# Patient Record
Sex: Female | Born: 1960 | Race: White | Hispanic: No | Marital: Married | Smoking: Never smoker
Health system: Southern US, Community
[De-identification: ages and names within clinical notes are randomized; demographics above are authoritative.]

## PROBLEM LIST (undated history)

## (undated) DIAGNOSIS — E785 Hyperlipidemia, unspecified: Secondary | ICD-10-CM

## (undated) DIAGNOSIS — Z87440 Personal history of urinary (tract) infections: Secondary | ICD-10-CM

## (undated) DIAGNOSIS — Z8619 Personal history of other infectious and parasitic diseases: Secondary | ICD-10-CM

## (undated) DIAGNOSIS — F419 Anxiety disorder, unspecified: Secondary | ICD-10-CM

## (undated) HISTORY — PX: WISDOM TOOTH EXTRACTION: SHX21

## (undated) HISTORY — DX: Personal history of urinary (tract) infections: Z87.440

## (undated) HISTORY — DX: Hyperlipidemia, unspecified: E78.5

## (undated) HISTORY — DX: Anxiety disorder, unspecified: F41.9

## (undated) HISTORY — PX: ABDOMINAL HYSTERECTOMY: SHX81

## (undated) HISTORY — DX: Personal history of other infectious and parasitic diseases: Z86.19

---
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
## 2001-12-04 HISTORY — PX: ECTOPIC PREGNANCY SURGERY: SHX613

## 2013-01-05 LAB — HM COLONOSCOPY

## 2016-12-04 HISTORY — PX: HYSTEROTOMY: SHX1776

## 2016-12-04 HISTORY — PX: BLADDER SURGERY: SHX569

## 2017-08-15 DIAGNOSIS — D251 Intramural leiomyoma of uterus: Secondary | ICD-10-CM | POA: Diagnosis not present

## 2017-08-20 DIAGNOSIS — D252 Subserosal leiomyoma of uterus: Secondary | ICD-10-CM | POA: Diagnosis not present

## 2017-08-20 DIAGNOSIS — R3915 Urgency of urination: Secondary | ICD-10-CM | POA: Diagnosis not present

## 2017-08-27 DIAGNOSIS — D251 Intramural leiomyoma of uterus: Secondary | ICD-10-CM | POA: Diagnosis not present

## 2017-09-12 DIAGNOSIS — N950 Postmenopausal bleeding: Secondary | ICD-10-CM | POA: Diagnosis not present

## 2017-09-18 DIAGNOSIS — N83209 Unspecified ovarian cyst, unspecified side: Secondary | ICD-10-CM | POA: Diagnosis not present

## 2017-09-18 DIAGNOSIS — N329 Bladder disorder, unspecified: Secondary | ICD-10-CM | POA: Diagnosis not present

## 2017-09-18 DIAGNOSIS — D252 Subserosal leiomyoma of uterus: Secondary | ICD-10-CM | POA: Diagnosis not present

## 2017-09-18 DIAGNOSIS — N8300 Follicular cyst of ovary, unspecified side: Secondary | ICD-10-CM | POA: Diagnosis not present

## 2017-09-18 DIAGNOSIS — D259 Leiomyoma of uterus, unspecified: Secondary | ICD-10-CM | POA: Diagnosis not present

## 2017-09-24 DIAGNOSIS — E28310 Symptomatic premature menopause: Secondary | ICD-10-CM | POA: Diagnosis not present

## 2017-09-27 DIAGNOSIS — N3001 Acute cystitis with hematuria: Secondary | ICD-10-CM | POA: Diagnosis not present

## 2017-09-27 DIAGNOSIS — R350 Frequency of micturition: Secondary | ICD-10-CM | POA: Diagnosis not present

## 2017-10-16 DIAGNOSIS — Z23 Encounter for immunization: Secondary | ICD-10-CM | POA: Diagnosis not present

## 2017-10-19 DIAGNOSIS — D494 Neoplasm of unspecified behavior of bladder: Secondary | ICD-10-CM | POA: Diagnosis not present

## 2017-10-28 DIAGNOSIS — D259 Leiomyoma of uterus, unspecified: Secondary | ICD-10-CM | POA: Insufficient documentation

## 2017-10-28 DIAGNOSIS — R3915 Urgency of urination: Secondary | ICD-10-CM | POA: Insufficient documentation

## 2017-10-30 DIAGNOSIS — Z01818 Encounter for other preprocedural examination: Secondary | ICD-10-CM | POA: Diagnosis not present

## 2017-11-05 DIAGNOSIS — D494 Neoplasm of unspecified behavior of bladder: Secondary | ICD-10-CM | POA: Diagnosis not present

## 2017-11-05 DIAGNOSIS — Z90710 Acquired absence of both cervix and uterus: Secondary | ICD-10-CM | POA: Diagnosis not present

## 2017-11-05 DIAGNOSIS — F419 Anxiety disorder, unspecified: Secondary | ICD-10-CM | POA: Diagnosis not present

## 2017-11-05 DIAGNOSIS — C678 Malignant neoplasm of overlapping sites of bladder: Secondary | ICD-10-CM | POA: Diagnosis not present

## 2017-11-05 DIAGNOSIS — F1099 Alcohol use, unspecified with unspecified alcohol-induced disorder: Secondary | ICD-10-CM | POA: Diagnosis not present

## 2017-11-05 DIAGNOSIS — N3289 Other specified disorders of bladder: Secondary | ICD-10-CM | POA: Diagnosis not present

## 2017-11-05 DIAGNOSIS — D303 Benign neoplasm of bladder: Secondary | ICD-10-CM | POA: Diagnosis not present

## 2017-11-05 DIAGNOSIS — N329 Bladder disorder, unspecified: Secondary | ICD-10-CM | POA: Diagnosis not present

## 2017-11-05 DIAGNOSIS — Z79899 Other long term (current) drug therapy: Secondary | ICD-10-CM | POA: Diagnosis not present

## 2017-11-05 DIAGNOSIS — R3915 Urgency of urination: Secondary | ICD-10-CM | POA: Diagnosis not present

## 2017-11-19 DIAGNOSIS — R300 Dysuria: Secondary | ICD-10-CM | POA: Diagnosis not present

## 2017-11-19 DIAGNOSIS — N3001 Acute cystitis with hematuria: Secondary | ICD-10-CM | POA: Diagnosis not present

## 2017-11-24 DIAGNOSIS — N390 Urinary tract infection, site not specified: Secondary | ICD-10-CM | POA: Diagnosis not present

## 2018-02-19 DIAGNOSIS — M25531 Pain in right wrist: Secondary | ICD-10-CM | POA: Diagnosis not present

## 2018-02-19 DIAGNOSIS — F411 Generalized anxiety disorder: Secondary | ICD-10-CM | POA: Diagnosis not present

## 2018-02-19 DIAGNOSIS — Z0000 Encounter for general adult medical examination without abnormal findings: Secondary | ICD-10-CM | POA: Diagnosis not present

## 2018-02-19 DIAGNOSIS — E7800 Pure hypercholesterolemia, unspecified: Secondary | ICD-10-CM | POA: Diagnosis not present

## 2018-02-19 DIAGNOSIS — Z23 Encounter for immunization: Secondary | ICD-10-CM | POA: Diagnosis not present

## 2018-02-22 DIAGNOSIS — Z0000 Encounter for general adult medical examination without abnormal findings: Secondary | ICD-10-CM | POA: Diagnosis not present

## 2018-02-22 DIAGNOSIS — E7800 Pure hypercholesterolemia, unspecified: Secondary | ICD-10-CM | POA: Diagnosis not present

## 2018-03-05 DIAGNOSIS — M654 Radial styloid tenosynovitis [de Quervain]: Secondary | ICD-10-CM | POA: Diagnosis not present

## 2018-03-05 DIAGNOSIS — M1811 Unilateral primary osteoarthritis of first carpometacarpal joint, right hand: Secondary | ICD-10-CM | POA: Diagnosis not present

## 2018-03-26 DIAGNOSIS — L820 Inflamed seborrheic keratosis: Secondary | ICD-10-CM | POA: Diagnosis not present

## 2018-03-26 DIAGNOSIS — L570 Actinic keratosis: Secondary | ICD-10-CM | POA: Diagnosis not present

## 2018-03-26 DIAGNOSIS — D1801 Hemangioma of skin and subcutaneous tissue: Secondary | ICD-10-CM | POA: Diagnosis not present

## 2018-03-26 DIAGNOSIS — L821 Other seborrheic keratosis: Secondary | ICD-10-CM | POA: Diagnosis not present

## 2018-03-26 DIAGNOSIS — L814 Other melanin hyperpigmentation: Secondary | ICD-10-CM | POA: Diagnosis not present

## 2018-03-26 DIAGNOSIS — D225 Melanocytic nevi of trunk: Secondary | ICD-10-CM | POA: Diagnosis not present

## 2019-03-12 ENCOUNTER — Telehealth: Payer: Self-pay | Admitting: Family Medicine

## 2019-03-12 NOTE — Telephone Encounter (Signed)
Marylynn Pearson,  I touched base with Dr Milinda Antis who said it would be ok for your wife to establish with her, but she would have to wait until we are again seeing new patients in the office (we're currently not seeing any physicals or new patients in the office due to the coronavirus pandemic so probably a few months out). Also, since she is an established provider it may sometimes be more difficult to get in to see her for acute visits.   I will touch base with our front office staff as well about this.

## 2019-06-30 ENCOUNTER — Ambulatory Visit: Payer: BC Managed Care – PPO | Admitting: Family Medicine

## 2019-06-30 ENCOUNTER — Encounter: Payer: Self-pay | Admitting: Family Medicine

## 2019-06-30 ENCOUNTER — Other Ambulatory Visit: Payer: Self-pay

## 2019-06-30 VITALS — BP 126/68 | HR 81 | Temp 98.0°F | Ht 64.5 in | Wt 148.2 lb

## 2019-06-30 DIAGNOSIS — Z1231 Encounter for screening mammogram for malignant neoplasm of breast: Secondary | ICD-10-CM | POA: Diagnosis not present

## 2019-06-30 DIAGNOSIS — H9193 Unspecified hearing loss, bilateral: Secondary | ICD-10-CM | POA: Diagnosis not present

## 2019-06-30 DIAGNOSIS — H9190 Unspecified hearing loss, unspecified ear: Secondary | ICD-10-CM | POA: Insufficient documentation

## 2019-06-30 DIAGNOSIS — F411 Generalized anxiety disorder: Secondary | ICD-10-CM | POA: Diagnosis not present

## 2019-06-30 DIAGNOSIS — F419 Anxiety disorder, unspecified: Secondary | ICD-10-CM | POA: Insufficient documentation

## 2019-06-30 MED ORDER — VENLAFAXINE HCL ER 75 MG PO CP24: 75.0000 mg | ORAL_CAPSULE | Freq: Every day | ORAL | 3 refills | Status: DC

## 2019-06-30 NOTE — Assessment & Plan Note (Signed)
Due for screening mammogram since moving her  Enc self exams Ref done for mammogram at [facility]

## 2019-06-30 NOTE — Assessment & Plan Note (Signed)
Hearing screen nl today  Will continue to follow

## 2019-06-30 NOTE — Progress Notes (Signed)
Subjective:    Patient ID: Synetta ShadowNancy Pha, female    DOB: 01/07/1961, 58 y.o.   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 Just wrapped up 30 y  Kind of semi retired   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  She gets 8 hours of sleep per night   Exercise-swim and walks for the most part  Plans to increase her swimming   Healthy-non smoker  Drinks 1 glass of wine per day   Sees gyn  Had a hysterectomy- for large fibroid  Gyn visit in 11/19   On HRT -patch- to disc reduction of dose at next visit -vivelle dot Does not have a lot of menopausal issues   Medical history  2018 had removal of B9 growth in bladder  2003 had ectopic pregnancy  Desires referral for mammogram  Widow - in 2011  Given effexor for anxiety/stress reaction  Still helps a lot - when she tried to decrease dose she became irritable and more anxious   BP Readings from Last 3 Encounters:  06/30/19 126/68   Pulse Rate: 81    Weight: 148 lb 4 oz (67.2 kg)  25.05 kg/m   Has not been screened for HIV /hep C Low risk and not interested   Thinks she had tetanus shot for her last physical 2019-? Not sure   Colonoscopy 2014 - normal with 10 y recall   Patient Active Problem List   Diagnosis Date Noted  . Anxiety disorder 06/30/2019  . Screening mammogram, encounter for 06/30/2019  . Hearing decreased 06/30/2019   Past Medical History:  Diagnosis Date  . History of chickenpox   . History  of UTI    Social History   Tobacco Use  . Smoking status: Never Smoker  . Smokeless tobacco: Never Used  Substance Use Topics  . Alcohol use: Yes    Comment: glass of wine a day  . Drug use: Never   Family History  Problem Relation Age of Onset  . Alzheimer's disease Mother   . Heart failure Father   . Hyperlipidemia Father   . COPD Brother   . Depression Brother   . Hyperlipidemia Brother   . Arthritis Paternal Grandmother    No Known Allergies Current Outpatient Medications on File Prior to Visit  Medication Sig Dispense Refill  . diclofenac sodium (VOLTAREN) 1 % GEL Apply 2 g topically daily as needed.    Marland Kitchen. estradiol (VIVELLE-DOT) 0.1 MG/24HR patch PLACE 1 PATCH ONTO SKIN TWICE A WEEK. TAKE OFF PREVIOUS BEFORE PUTTING ON NEW PATCH     No current facility-administered medications on file prior to visit.      Review of Systems  Constitutional: Negative for activity change, appetite change, fatigue, fever and unexpected weight change.  HENT: Positive  for hearing loss. Negative for congestion, ear discharge, ear pain, rhinorrhea, sinus pressure and sore throat.   Eyes: Negative for pain, redness and visual disturbance.  Respiratory: Negative for cough, shortness of breath and wheezing.   Cardiovascular: Negative for chest pain and palpitations.  Gastrointestinal: Negative for abdominal pain, blood in stool, constipation and diarrhea.  Endocrine: Negative for polydipsia and polyuria.  Genitourinary: Negative for dysuria, frequency and urgency.  Musculoskeletal: Negative for arthralgias, back pain and myalgias.  Skin: Negative for pallor and rash.  Allergic/Immunologic: Negative for environmental allergies.  Neurological: Negative for dizziness, syncope and headaches.  Hematological: Negative for adenopathy. Does not bruise/bleed easily.  Psychiatric/Behavioral: Positive for sleep disturbance. Negative for agitation, behavioral problems, decreased concentration, dysphoric  mood and self-injury. The patient is nervous/anxious.        Objective:   Physical Exam Constitutional:      General: She is not in acute distress.    Appearance: Normal appearance. She is well-developed and normal weight. She is not ill-appearing or diaphoretic.  HENT:     Head: Normocephalic and atraumatic.     Right Ear: Tympanic membrane, ear canal and external ear normal.     Left Ear: Tympanic membrane, ear canal and external ear normal.     Nose: Nose normal.     Mouth/Throat:     Mouth: Mucous membranes are moist.     Pharynx: Oropharynx is clear. No posterior oropharyngeal erythema.  Eyes:     General: No scleral icterus.       Right eye: No discharge.        Left eye: No discharge.     Conjunctiva/sclera: Conjunctivae normal.     Pupils: Pupils are equal, round, and reactive to light.  Neck:     Musculoskeletal: Normal range of motion and neck supple. No neck rigidity or muscular tenderness.     Thyroid: No thyromegaly.     Vascular: No carotid bruit or JVD.  Cardiovascular:     Rate and Rhythm: Normal rate and regular rhythm.     Heart sounds: Normal heart sounds. No gallop.   Pulmonary:     Effort: Pulmonary effort is normal. No respiratory distress.     Breath sounds: Normal breath sounds. No wheezing or rales.     Comments: Good air exch Abdominal:     General: Bowel sounds are normal. There is no distension.     Palpations: Abdomen is soft. There is no mass.     Tenderness: There is no abdominal tenderness.     Hernia: No hernia is present.  Musculoskeletal:        General: No tenderness.     Right lower leg: No edema.     Left lower leg: No edema.  Lymphadenopathy:     Cervical: No cervical adenopathy.  Skin:    General: Skin is warm and dry.     Coloration: Skin is not pale.     Findings: No erythema or rash.     Comments: Solar lentigines diffusely   Neurological:     Mental Status: She is alert.     Cranial Nerves: No cranial nerve deficit.      Motor: No abnormal muscle tone.     Coordination: Coordination normal.     Gait: Gait normal.     Deep Tendon Reflexes: Reflexes are normal and symmetric. Reflexes normal.  Psychiatric:        Attention and Perception: She is attentive.  Mood and Affect: Mood normal. Mood is not anxious or depressed.        Speech: Speech normal.        Behavior: Behavior normal.        Cognition and Memory: Cognition and memory normal.     Comments: Pleasant and talkative            Assessment & Plan:   Problem List Items Addressed This Visit      Other   Anxiety disorder - Primary    Pt takes effexor 75 mg XR for years  Very helpful -since she was widowed in 2011 Unable to stop it successfully  Not seeing counselor Good self care habits       Relevant Medications   venlafaxine XR (EFFEXOR-XR) 75 MG 24 hr capsule   Screening mammogram, encounter for    Due for screening mammogram since moving her  Enc self exams Ref done for mammogram at [facility]       Relevant Orders   MM 3D SCREEN BREAST BILATERAL   Hearing decreased    Hearing screen nl today  Will continue to follow

## 2019-06-30 NOTE — Patient Instructions (Addendum)
Make sure to schedule your mammogram at the [facility] breast center   Make sure you get a flu shot in the fall   I will send for last info from Dr Germain Osgood   Take care of yourself  Stay active   Let's do a hearing screen

## 2019-06-30 NOTE — Assessment & Plan Note (Signed)
Pt takes effexor 75 mg XR for years  Very helpful -since she was widowed in 2011 Unable to stop it successfully  Not seeing counselor Good self care habits

## 2019-07-01 ENCOUNTER — Ambulatory Visit
Admission: RE | Admit: 2019-07-01 | Discharge: 2019-07-01 | Disposition: A | Discharge status: Home / Self Care | Payer: BC Managed Care – PPO | Source: Ambulatory Visit | Attending: Family Medicine | Admitting: Family Medicine

## 2019-07-01 DIAGNOSIS — Z1231 Encounter for screening mammogram for malignant neoplasm of breast: Secondary | ICD-10-CM | POA: Insufficient documentation

## 2019-07-30 ENCOUNTER — Telehealth: Payer: Self-pay

## 2019-07-30 NOTE — Telephone Encounter (Signed)
Pt left v/m requesting cb about process for getting flu shot;FYI to [place] at front desk.

## 2019-07-30 NOTE — Telephone Encounter (Signed)
Patient scheduled flu shot appointment on 08/01/19.

## 2019-08-01 ENCOUNTER — Ambulatory Visit (INDEPENDENT_AMBULATORY_CARE_PROVIDER_SITE_OTHER): Payer: BC Managed Care – PPO

## 2019-08-01 DIAGNOSIS — Z23 Encounter for immunization: Secondary | ICD-10-CM

## 2019-10-21 DIAGNOSIS — F4322 Adjustment disorder with anxiety: Secondary | ICD-10-CM | POA: Diagnosis not present

## 2019-11-21 ENCOUNTER — Other Ambulatory Visit: Payer: BC Managed Care – PPO

## 2019-12-08 ENCOUNTER — Encounter: Payer: Self-pay | Admitting: Family Medicine

## 2019-12-08 ENCOUNTER — Ambulatory Visit (INDEPENDENT_AMBULATORY_CARE_PROVIDER_SITE_OTHER): Payer: BC Managed Care – PPO | Admitting: Family Medicine

## 2019-12-08 ENCOUNTER — Other Ambulatory Visit: Payer: Self-pay

## 2019-12-08 VITALS — BP 116/70 | HR 50 | Temp 96.6°F | Ht 64.5 in | Wt 154.5 lb

## 2019-12-08 DIAGNOSIS — L290 Pruritus ani: Secondary | ICD-10-CM | POA: Insufficient documentation

## 2019-12-08 DIAGNOSIS — L299 Pruritus, unspecified: Secondary | ICD-10-CM | POA: Diagnosis not present

## 2019-12-08 MED ORDER — HYDROCORTISONE 1 % EX CREA: 1.0000 "application " | TOPICAL_CREAM | Freq: Two times a day (BID) | CUTANEOUS | 0 refills | Status: DC

## 2019-12-08 MED ORDER — CETIRIZINE HCL 10 MG PO TABS: 10.0000 mg | ORAL_TABLET | Freq: Every day | ORAL | 0 refills | Status: DC

## 2019-12-08 NOTE — Assessment & Plan Note (Signed)
Slight scale in ear canals  ? If allergy related or eczema/dermatitis Try tx with 1% hydrocortisone bid prn - small amt to ear canals Also antihistamine zyrtec 10 mg each bedtime (change from allegra)  Update if not starting to improve in a week or if worsening

## 2019-12-08 NOTE — Patient Instructions (Addendum)
Take zyrtec 10 mg each bedtime (generic is fine)  Stop the allegra    Try the 1% hydrocortisone cream twice daily to ear canals and also anal area (do not exceed 14 days in a row)   Keep the anal area dry (after bathing)  Use regular toilet paper (nothing wet)  A barrier cream like A and D or Desitin is good for maintenance   Let us know if no improvement in 1-2 weeks

## 2019-12-08 NOTE — Assessment & Plan Note (Signed)
No findings on exam -unsure of cause Px hydrocortisone cream to try topically (1%) bid for 1-2 weeks Then inst to switch over to barrier cream (use after washing /drying completely) Update if not starting to improve in a week or if worsening

## 2019-12-08 NOTE — Progress Notes (Signed)
Subjective:    Patient ID: Beth Nielsen, female    DOB: [birth year], 59 y.o.   MRN: [id number]  HPI Pt presents with ear pain -bilateral/ worse in Right  Itchy  Going on for about 9 months on and off  She used some anti fungal cream - it helped a little perhaps   ? From sweat or water  Swims seasonally  No pain/ tenderness of external  Hearing is not changed  No popping  No drainage   No h/o eczema or dandruff   No nasal congestion  A little chronic rhinitis -allergy   Also anal itching -this is new  - a few months  Worse at night  Has not been able to look at area  Right at anal opening  Has not felt a lump  No diarrhea or constipation   Had to change toilet paper brands during covid  No wet wipes Does shower every day-uses dove soap    No exposure to pin worms   She takes antihistamines otc  Allegra right now   Patient Active Problem List   Diagnosis Date Noted  . Anal itching 12/08/2019  . Ear itching 12/08/2019  . Anxiety disorder 06/30/2019  . Screening mammogram, encounter for 06/30/2019  . Hearing decreased 06/30/2019   Past Medical History:  Diagnosis Date  . History of chickenpox   . History of UTI    Past Surgical History:  Procedure Laterality Date  . BLADDER SURGERY  2018   removal of benign growth  . ECTOPIC PREGNANCY SURGERY  2003  . HYSTEROTOMY  2018  . TONSILLECTOMY  1972  . WISDOM TOOTH EXTRACTION     age 28   Social History   Tobacco Use  . Smoking status: Never Smoker  . Smokeless tobacco: Never Used  Substance Use Topics  . Alcohol use: Yes    Comment: glass of wine a day  . Drug use: Never   Family History  Problem Relation Age of Onset  . Alzheimer's disease Mother   . Heart failure Father   . Hyperlipidemia Father   . COPD Brother   . Depression Brother   . Hyperlipidemia Brother   . Arthritis Paternal Grandmother   . Breast cancer Neg Hx    No Known Allergies Current Outpatient Medications on File Prior to  Visit  Medication Sig Dispense Refill  . diclofenac sodium (VOLTAREN) 1 % GEL Apply 2 g topically daily as needed.    Marland Kitchen estradiol (VIVELLE-DOT) 0.1 MG/24HR patch PLACE 1 PATCH ONTO SKIN TWICE A WEEK. TAKE OFF PREVIOUS BEFORE PUTTING ON NEW PATCH    . venlafaxine XR (EFFEXOR-XR) 75 MG 24 hr capsule Take 1 capsule (75 mg total) by mouth daily. 90 capsule 3   No current facility-administered medications on file prior to visit.     Review of Systems  Constitutional: Negative for activity change, appetite change, fatigue, fever and unexpected weight change.  HENT: Positive for postnasal drip. Negative for congestion, ear discharge, ear pain, facial swelling, hearing loss, rhinorrhea, sinus pressure, sore throat, trouble swallowing and voice change.   Eyes: Negative for pain, redness and visual disturbance.  Respiratory: Negative for cough, shortness of breath and wheezing.   Cardiovascular: Negative for chest pain and palpitations.  Gastrointestinal: Negative for abdominal distention, abdominal pain, anal bleeding, blood in stool, constipation, diarrhea and rectal pain.  Endocrine: Negative for polydipsia and polyuria.  Genitourinary: Negative for dysuria, frequency and urgency.  Musculoskeletal: Negative for arthralgias, back pain and myalgias.  Skin: Negative for color change, pallor and rash.  Allergic/Immunologic: Negative for environmental allergies.  Neurological: Negative for dizziness, syncope and headaches.  Hematological: Negative for adenopathy. Does not bruise/bleed easily.  Psychiatric/Behavioral: Negative for decreased concentration and dysphoric mood. The patient is not nervous/anxious.        Objective:   Physical Exam Constitutional:      Appearance: Normal appearance. She is normal weight. She is not ill-appearing.  HENT:     Head: Normocephalic and atraumatic.     Right Ear: Tympanic membrane and external ear normal. There is no impacted cerumen.     Left Ear: Tympanic  membrane and external ear normal. There is no impacted cerumen.     Ears:     Comments: Scant flaky skin inside ear canals (worse on the R)  Nl appearing TM No erythema or swelling or drainage      Nose: Nose normal.     Comments: Nares are boggy    Mouth/Throat:     Mouth: Mucous membranes are moist.     Pharynx: Oropharynx is clear. No posterior oropharyngeal erythema.  Eyes:     General:        Right eye: No discharge.        Left eye: No discharge.     Extraocular Movements: Extraocular movements intact.     Conjunctiva/sclera: Conjunctivae normal.     Pupils: Pupils are equal, round, and reactive to light.  Cardiovascular:     Rate and Rhythm: Regular rhythm. Bradycardia present.     Pulses: Normal pulses.  Pulmonary:     Effort: Pulmonary effort is normal. No respiratory distress.     Breath sounds: Normal breath sounds. No wheezing or rales.  Genitourinary:    Comments: Nl appearing anal area  No skin change  No excoriation No hemorrhoids  Musculoskeletal:     Cervical back: Neck supple.  Lymphadenopathy:     Cervical: No cervical adenopathy.  Skin:    General: Skin is warm and dry.     Coloration: Skin is not jaundiced or pale.     Findings: No erythema or lesion.     Comments: slt scaling of skin in ear canals   Neurological:     Mental Status: She is alert.     Cranial Nerves: No cranial nerve deficit.  Psychiatric:        Mood and Affect: Mood normal.           Assessment & Plan:   Problem List Items Addressed This Visit      Musculoskeletal and Integument   Anal itching    No findings on exam -unsure of cause Px hydrocortisone cream to try topically (1%) bid for 1-2 weeks Then inst to switch over to barrier cream (use after washing /drying completely) Update if not starting to improve in a week or if worsening          Other   Ear itching - Primary    Slight scale in ear canals  ? If allergy related or eczema/dermatitis Try tx with 1%  hydrocortisone bid prn - small amt to ear canals Also antihistamine zyrtec 10 mg each bedtime (change from allegra)  Update if not starting to improve in a week or if worsening

## 2019-12-29 ENCOUNTER — Ambulatory Visit: Payer: BC Managed Care – PPO | Attending: Internal Medicine

## 2019-12-29 DIAGNOSIS — Z20822 Contact with and (suspected) exposure to covid-19: Secondary | ICD-10-CM

## 2019-12-30 LAB — NOVEL CORONAVIRUS, NAA: SARS-CoV-2, NAA: NOT DETECTED

## 2020-01-09 ENCOUNTER — Ambulatory Visit: Payer: BC Managed Care – PPO | Attending: Internal Medicine

## 2020-01-09 DIAGNOSIS — Z20822 Contact with and (suspected) exposure to covid-19: Secondary | ICD-10-CM | POA: Diagnosis not present

## 2020-01-11 LAB — NOVEL CORONAVIRUS, NAA: SARS-CoV-2, NAA: NOT DETECTED

## 2020-01-27 DIAGNOSIS — Z79890 Hormone replacement therapy (postmenopausal): Secondary | ICD-10-CM | POA: Diagnosis not present

## 2020-01-27 DIAGNOSIS — Z01419 Encounter for gynecological examination (general) (routine) without abnormal findings: Secondary | ICD-10-CM | POA: Diagnosis not present

## 2020-01-27 DIAGNOSIS — N951 Menopausal and female climacteric states: Secondary | ICD-10-CM | POA: Diagnosis not present

## 2020-01-28 ENCOUNTER — Other Ambulatory Visit: Payer: BC Managed Care – PPO

## 2020-01-29 ENCOUNTER — Other Ambulatory Visit: Payer: BC Managed Care – PPO

## 2020-02-01 ENCOUNTER — Ambulatory Visit: Payer: BC Managed Care – PPO | Attending: Internal Medicine

## 2020-02-01 DIAGNOSIS — Z23 Encounter for immunization: Secondary | ICD-10-CM | POA: Insufficient documentation

## 2020-02-01 NOTE — Progress Notes (Signed)
   Covid-19 Vaccination Clinic  Name:  Beth Nielsen    MRN: [id number] DOB: 08/22/1961  02/01/2020  [name] was observed post Covid-19 immunization for 15 minutes without incidence. She was provided with Vaccine Information Sheet and instruction to access the V-Safe system.   [name] was instructed to call 911 with any severe reactions post vaccine: Marland Kitchen Difficulty breathing  . Swelling of your face and throat  . A fast heartbeat  . A bad rash all over your body  . Dizziness and weakness    Immunizations Administered    Name Date Dose VIS Date Route   Pfizer COVID-19 Vaccine 02/01/2020  8:59 AM 0.3 mL 11/14/2019 Intramuscular   Manufacturer: [employer], [employer]   Lot: [id number]   NDC: [address]-6

## 2020-02-23 ENCOUNTER — Ambulatory Visit: Payer: BC Managed Care – PPO | Attending: Internal Medicine

## 2020-02-23 DIAGNOSIS — Z23 Encounter for immunization: Secondary | ICD-10-CM

## 2020-02-23 NOTE — Progress Notes (Signed)
   Covid-19 Vaccination Clinic  Name:  Beth Nielsen    MRN: [id number] DOB: [birth year]  02/23/2020  [name] was observed post Covid-19 immunization for 15 minutes without incident. She was provided with Vaccine Information Sheet and instruction to access the V-Safe system.   [name] was instructed to call 911 with any severe reactions post vaccine: Marland Kitchen Difficulty breathing  . Swelling of face and throat  . A fast heartbeat  . A bad rash all over body  . Dizziness and weakness   Immunizations Administered    Name Date Dose VIS Date Route   Pfizer COVID-19 Vaccine 02/23/2020  9:07 AM 0.3 mL 11/14/2019 Intramuscular   Manufacturer: [employer], [employer]   Lot: [id number]   NDC: [address]-8

## 2020-05-24 ENCOUNTER — Other Ambulatory Visit: Payer: Self-pay | Admitting: Family Medicine

## 2020-05-24 ENCOUNTER — Other Ambulatory Visit: Payer: Self-pay | Admitting: Obstetrics and Gynecology

## 2020-05-24 DIAGNOSIS — Z1231 Encounter for screening mammogram for malignant neoplasm of breast: Secondary | ICD-10-CM

## 2020-06-20 ENCOUNTER — Other Ambulatory Visit: Payer: Self-pay | Admitting: Family Medicine

## 2020-06-30 ENCOUNTER — Telehealth (INDEPENDENT_AMBULATORY_CARE_PROVIDER_SITE_OTHER): Payer: BC Managed Care – PPO | Admitting: Family Medicine

## 2020-06-30 ENCOUNTER — Encounter: Payer: Self-pay | Admitting: Family Medicine

## 2020-06-30 VITALS — Temp 97.1°F | Ht 65.0 in | Wt 148.0 lb

## 2020-06-30 DIAGNOSIS — J029 Acute pharyngitis, unspecified: Secondary | ICD-10-CM | POA: Diagnosis not present

## 2020-06-30 NOTE — Assessment & Plan Note (Signed)
Most likely allergies vs. Viral infeciton. Pt s/p vaccination for COVID.Marland Kitchen doubt COVID as not typical presentation.  No clear sign of bacteria infection. Strep less likely given age, mild symptoms and presence of mucus.   treat with flonase and change to Zyrtec at bedtime. If not improving.. recommend in person eval at [facility] for strep test, COPVID test and eval of ear and throat with exam.

## 2020-06-30 NOTE — Patient Instructions (Addendum)
Flonase 2 sprays per nostril daily.  Change to zyrtec at bedtime.  Rest and fluids  If not improving in 48 hours. Consider strep test, ear evaluation and COVID test at [facility].

## 2020-06-30 NOTE — Progress Notes (Signed)
VIRTUAL VISIT Due to national recommendations of social distancing due to COVID 19, a virtual visit is felt to be most appropriate for this patient at this time.   I connected with the patient on 06/30/20 at 12:00 PM EDT by virtual telehealth platform and verified that I am speaking with the correct person using two identifiers.   I discussed the limitations, risks, security and privacy concerns of performing an evaluation and management service by  virtual telehealth platform and the availability of in person appointments. I also discussed with the patient that there may be a patient responsible charge related to this service. The patient expressed understanding and agreed to proceed.  Patient location: Home Provider Location: [place] [facility] Participants: Kerby Nora and Synetta Shadow   Chief Complaint  Patient presents with  . Sore Throat    x 4 days-one side of throat  . Nasal Congestion    History of Present Illness: Sore Throat  This is a new problem. The current episode started in the past 7 days (4 days). The problem has been gradually worsening. The pain is worse on the right side. There has been no fever. The pain is at a severity of 2/10. The pain is mild. Associated symptoms include ear pain. Pertinent negatives include no congestion, coughing, shortness of breath, trouble swallowing or vomiting. Associated symptoms comments: Right ear pressure, has some post nasal drip. She has had no exposure to strep or mono. Treatments tried: has not improved with allergy pill: loratadine 10 mg  The treatment provided no relief.   Husband is going to [place] next week.   No sick contact... no strep , no mono.  She no longer wears a mask. S/P COVID19 vaccine.  COVID 19 screen No recent travel or known exposure to COVID19   The importance of social distancing was discussed today.   Review of Systems  HENT: Positive for ear pain. Negative for congestion and trouble swallowing.    Respiratory: Negative for cough and shortness of breath.   Gastrointestinal: Negative for vomiting.      Past Medical History:  Diagnosis Date  . History of chickenpox   . History of UTI     reports that she has never smoked. She has never used smokeless tobacco. She reports current alcohol use. She reports that she does not use drugs.   Current Outpatient Medications:  .  diclofenac sodium (VOLTAREN) 1 % GEL, Apply 2 g topically daily as needed., Disp: , Rfl:  .  estradiol (VIVELLE-DOT) 0.1 MG/24HR patch, PLACE 1 PATCH ONTO SKIN TWICE A WEEK. TAKE OFF PREVIOUS BEFORE PUTTING ON NEW PATCH, Disp: , Rfl:  .  hydrocortisone cream 1 %, Apply 1 application topically 2 (two) times daily. To affected areas as needed, do not exceed 14 days of continuous use, Disp: 30 g, Rfl: 0 .  venlafaxine XR (EFFEXOR-XR) 75 MG 24 hr capsule, TAKE 1 CAPSULE BY MOUTH EVERY DAY, Disp: 90 capsule, Rfl: 3 .  cetirizine (ZYRTEC) 10 MG tablet, Take 1 tablet (10 mg total) by mouth daily. (Patient not taking: Reported on 06/30/2020), Disp: 1 tablet, Rfl: 0   Observations/Objective: Temperature (!) 97.1 F (36.2 C), temperature source Oral, height 5\' 5"  (1.651 m), weight 148 lb (67.1 kg).  Physical Exam  Physical Exam Constitutional:      General: The patient is not in acute distress. Pulmonary:     Effort: Pulmonary effort is normal. No respiratory distress.  Neurological:     Mental Status: The patient  is alert and oriented to person, place, and time.  Psychiatric:        Mood and Affect: Mood normal.        Behavior: Behavior normal.    Assessment and Plan  Sore throat Most likely allergies vs. Viral infeciton. Pt s/p vaccination for COVID.Marland Kitchen doubt COVID as not typical presentation.  No clear sign of bacteria infection. Strep less likely given age, mild symptoms and presence of mucus.   treat with flonase and change to Zyrtec at bedtime. If not improving.. recommend in person eval at [facility] for  strep test, COPVID test and eval of ear and throat with exam.    I discussed the assessment and treatment plan with the patient. The patient was provided an opportunity to ask questions and all were answered. The patient agreed with the plan and demonstrated an understanding of the instructions.   The patient was advised to call back or seek an in-person evaluation if the symptoms worsen or if the condition fails to improve as anticipated.     Kerby Nora, MD

## 2020-07-02 ENCOUNTER — Other Ambulatory Visit: Payer: Self-pay

## 2020-07-02 ENCOUNTER — Ambulatory Visit
Admission: RE | Admit: 2020-07-02 | Discharge: 2020-07-02 | Disposition: A | Discharge status: Home / Self Care | Payer: BC Managed Care – PPO | Source: Ambulatory Visit | Attending: Obstetrics and Gynecology | Admitting: Obstetrics and Gynecology

## 2020-07-02 DIAGNOSIS — Z1231 Encounter for screening mammogram for malignant neoplasm of breast: Secondary | ICD-10-CM | POA: Insufficient documentation

## 2020-07-03 DIAGNOSIS — J029 Acute pharyngitis, unspecified: Secondary | ICD-10-CM | POA: Diagnosis not present

## 2020-07-03 DIAGNOSIS — H6980 Other specified disorders of Eustachian tube, unspecified ear: Secondary | ICD-10-CM | POA: Diagnosis not present

## 2020-07-04 ENCOUNTER — Telehealth: Payer: Self-pay | Admitting: Family Medicine

## 2020-07-04 DIAGNOSIS — Z0000 Encounter for general adult medical examination without abnormal findings: Secondary | ICD-10-CM | POA: Insufficient documentation

## 2020-07-04 NOTE — Telephone Encounter (Signed)
-----   Message from Aquilla Solian, RT sent at 06/29/2020  3:08 PM EDT ----- Regarding: Lab Orders for Monday 8.2.2021 Please place lab orders for Monday 8.2.2021, office visit for physical on Monday 8.9.2021 Thank you, Jones Bales RT(R)

## 2020-07-05 ENCOUNTER — Other Ambulatory Visit: Payer: Self-pay

## 2020-07-05 ENCOUNTER — Other Ambulatory Visit (INDEPENDENT_AMBULATORY_CARE_PROVIDER_SITE_OTHER): Payer: BC Managed Care – PPO

## 2020-07-05 DIAGNOSIS — Z0000 Encounter for general adult medical examination without abnormal findings: Secondary | ICD-10-CM | POA: Diagnosis not present

## 2020-07-05 LAB — COMPREHENSIVE METABOLIC PANEL
ALT: 11 U/L (ref 0–35)
AST: 16 U/L (ref 0–37)
Albumin: 4.2 g/dL (ref 3.5–5.2)
Alkaline Phosphatase: 50 U/L (ref 39–117)
BUN: 15 mg/dL (ref 6–23)
CO2: 29 mEq/L (ref 19–32)
Calcium: 9.4 mg/dL (ref 8.4–10.5)
Chloride: 103 mEq/L (ref 96–112)
Creatinine, Ser: 0.75 mg/dL (ref 0.40–1.20)
GFR: 79.58 mL/min (ref >60.00)
Glucose, Bld: 98 mg/dL (ref 70–99)
Potassium: 4.5 mEq/L (ref 3.5–5.1)
Sodium: 137 mEq/L (ref 135–145)
Total Bilirubin: 0.3 mg/dL (ref 0.2–1.2)
Total Protein: 6.9 g/dL (ref 6.0–8.3)

## 2020-07-05 LAB — CBC WITH DIFFERENTIAL/PLATELET
Basophils Absolute: 0.1 10*3/uL (ref 0.0–0.1)
Basophils Relative: 1.1 % (ref 0.0–3.0)
Eosinophils Absolute: 0.2 10*3/uL (ref 0.0–0.7)
Eosinophils Relative: 4 % (ref 0.0–5.0)
HCT: 39 % (ref 36.0–46.0)
Hemoglobin: 13.4 g/dL (ref 12.0–15.0)
Lymphocytes Relative: 30 % (ref 12.0–46.0)
Lymphs Abs: 1.5 10*3/uL (ref 0.7–4.0)
MCHC: 34.2 g/dL (ref 30.0–36.0)
MCV: 92 fl (ref 78.0–100.0)
Monocytes Absolute: 0.4 10*3/uL (ref 0.1–1.0)
Monocytes Relative: 8.2 % (ref 3.0–12.0)
Neutro Abs: 2.8 10*3/uL (ref 1.4–7.7)
Neutrophils Relative %: 56.7 % (ref 43.0–77.0)
Platelets: 350 10*3/uL (ref 150.0–400.0)
RBC: 4.24 Mil/uL (ref 3.87–5.11)
RDW: 12.9 % (ref 11.5–15.5)
WBC: 5 10*3/uL (ref 4.0–10.5)

## 2020-07-05 LAB — LIPID PANEL
Cholesterol: 238 mg/dL — ABNORMAL HIGH (ref 0–200)
HDL: 59.8 mg/dL (ref >39.00)
LDL Cholesterol: 146 mg/dL — ABNORMAL HIGH (ref 0–99)
NonHDL: 178.66
Total CHOL/HDL Ratio: 4
Triglycerides: 161 mg/dL — ABNORMAL HIGH (ref 0.0–149.0)
VLDL: 32.2 mg/dL (ref 0.0–40.0)

## 2020-07-05 LAB — TSH: TSH: 1.86 u[IU]/mL (ref 0.35–4.50)

## 2020-07-12 ENCOUNTER — Other Ambulatory Visit: Payer: Self-pay

## 2020-07-12 ENCOUNTER — Encounter: Payer: Self-pay | Admitting: Family Medicine

## 2020-07-12 ENCOUNTER — Ambulatory Visit (INDEPENDENT_AMBULATORY_CARE_PROVIDER_SITE_OTHER): Payer: BC Managed Care – PPO | Admitting: Family Medicine

## 2020-07-12 VITALS — BP 118/76 | HR 66 | Temp 96.7°F | Ht 64.0 in | Wt 150.1 lb

## 2020-07-12 DIAGNOSIS — Z0000 Encounter for general adult medical examination without abnormal findings: Secondary | ICD-10-CM

## 2020-07-12 DIAGNOSIS — E7800 Pure hypercholesterolemia, unspecified: Secondary | ICD-10-CM

## 2020-07-12 DIAGNOSIS — Z0001 Encounter for general adult medical examination with abnormal findings: Secondary | ICD-10-CM

## 2020-07-12 DIAGNOSIS — L237 Allergic contact dermatitis due to plants, except food: Secondary | ICD-10-CM

## 2020-07-12 DIAGNOSIS — F411 Generalized anxiety disorder: Secondary | ICD-10-CM | POA: Diagnosis not present

## 2020-07-12 DIAGNOSIS — E785 Hyperlipidemia, unspecified: Secondary | ICD-10-CM | POA: Insufficient documentation

## 2020-07-12 MED ORDER — PREDNISONE 10 MG PO TABS
ORAL_TABLET | ORAL | 0 refills | Status: DC
Start: 2020-07-12 — End: 2021-04-06

## 2020-07-12 MED ORDER — ROSUVASTATIN CALCIUM 5 MG PO TABS: 5.0000 mg | ORAL_TABLET | Freq: Every day | ORAL | 11 refills | Status: DC

## 2020-07-12 NOTE — Progress Notes (Signed)
Subjective:    Patient ID: Beth Nielsen, female    DOB: 03/07/1961, 59 y.o.   MRN: [id number]  This visit occurred during the SARS-CoV-2 public health emergency.  Safety protocols were in place, including screening questions prior to the visit, additional usage of staff PPE, and extensive cleaning of exam room while observing appropriate contact time as indicated for disinfecting solutions.    HPI Here for health maintenance exam and to review chronic medical problems    Wt Readings from Last 3 Encounters:  07/12/20 150 lb 1.6 oz (68.1 kg)  06/30/20 148 lb (67.1 kg)  12/08/19 154 lb 8 oz (70.1 kg)   25.76 kg/m    Flu vaccine -will get in the fall  Td 3/19 covid status -immunized  Plans on shingrix    Mammogram 7/21 Self breast exam -no lumps   Uses [facility] for HRT  Had a hysterectomy in 2018  She goes to gyn for breast exam/did pelvic exam    Colonoscopy 2/14   Had had issues with poison ivy Was exposed on Tuesday/wed  Still popping up on different parts ? If her dog has some on it  Itchy Using calamine lotion Takes antihistamine Soap and water      BP Readings from Last 3 Encounters:  07/12/20 118/76  12/08/19 116/70  06/30/19 126/68   Pulse Readings from Last 3 Encounters:  07/12/20 66  12/08/19 (!) 50  06/30/19 81     Cholesterol Lab Results  Component Value Date   CHOL 238 (H) 07/05/2020   Lab Results  Component Value Date   HDL 59.80 07/05/2020   Lab Results  Component Value Date   LDLCALC 146 (H) 07/05/2020   Lab Results  Component Value Date   TRIG 161.0 (H) 07/05/2020   Lab Results  Component Value Date   CHOLHDL 4 07/05/2020   No results found for: LDLDIRECT   In the past LDL was in 130s and lower triglycerides  Father had CAD and high cholesterol   No change in her diet -excellent  She drinks wine (2 glasses per night)  Is open to a statin    Other labs Results for orders placed or performed in visit on  07/05/20  TSH  Result Value Ref Range   TSH 1.86 0.35 - 4.50 uIU/mL  Lipid panel  Result Value Ref Range   Cholesterol 238 (H) 0 - 200 mg/dL   Triglycerides 409.8161.0 (H) 0 - 149 mg/dL   HDL 11.9159.80 >47.82>39.00 mg/dL   VLDL 95.632.2 0.0 - 21.340.0 mg/dL   LDL Cholesterol 086146 (H) 0 - 99 mg/dL   Total CHOL/HDL Ratio 4    NonHDL 178.66   Comprehensive metabolic panel  Result Value Ref Range   Sodium 137 135 - 145 mEq/L   Potassium 4.5 3.5 - 5.1 mEq/L   Chloride 103 96 - 112 mEq/L   CO2 29 19 - 32 mEq/L   Glucose, Bld 98 70 - 99 mg/dL   BUN 15 6 - 23 mg/dL   Creatinine, Ser 5.780.75 0.40 - 1.20 mg/dL   Total Bilirubin 0.3 0.2 - 1.2 mg/dL   Alkaline Phosphatase 50 39 - 117 U/L   AST 16 0 - 37 U/L   ALT 11 0 - 35 U/L   Total Protein 6.9 6.0 - 8.3 g/dL   Albumin 4.2 3.5 - 5.2 g/dL   GFR 46.9679.58 >29.52>60.00 mL/min   Calcium 9.4 8.4 - 10.5 mg/dL  CBC with Differential/Platelet  Result Value Ref  Range   WBC 5.0 4.0 - 10.5 K/uL   RBC 4.24 3.87 - 5.11 Mil/uL   Hemoglobin 13.4 12.0 - 15.0 g/dL   HCT 96.7 36 - 46 %   MCV 92.0 78.0 - 100.0 fl   MCHC 34.2 30.0 - 36.0 g/dL   RDW 59.1 63.8 - 46.6 %   Platelets 350.0 150 - 400 K/uL   Neutrophils Relative % 56.7 43 - 77 %   Lymphocytes Relative 30.0 12 - 46 %   Monocytes Relative 8.2 3 - 12 %   Eosinophils Relative 4.0 0 - 5 %   Basophils Relative 1.1 0 - 3 %   Neutro Abs 2.8 1.4 - 7.7 K/uL   Lymphs Abs 1.5 0.7 - 4.0 K/uL   Monocytes Absolute 0.4 0 - 1 K/uL   Eosinophils Absolute 0.2 0 - 0 K/uL   Basophils Absolute 0.1 0 - 0 K/uL    Patient Active Problem List   Diagnosis Date Noted  . Poison ivy dermatitis 07/12/2020  . Hyperlipidemia 07/12/2020  . Routine general medical examination at a health care facility 07/04/2020  . Sore throat 06/30/2020  . Anal itching 12/08/2019  . Ear itching 12/08/2019  . Anxiety disorder 06/30/2019  . Screening mammogram, encounter for 06/30/2019  . Hearing decreased 06/30/2019   Past Medical History:  Diagnosis Date  .  History of chickenpox   . History of UTI    Past Surgical History:  Procedure Laterality Date  . BLADDER SURGERY  2018   removal of benign growth  . ECTOPIC PREGNANCY SURGERY  2003  . HYSTEROTOMY  2018  . TONSILLECTOMY  1972  . WISDOM TOOTH EXTRACTION     age 89   Social History   Tobacco Use  . Smoking status: Never Smoker  . Smokeless tobacco: Never Used  Substance Use Topics  . Alcohol use: Yes    Comment: glass of wine a day  . Drug use: Never   Family History  Problem Relation Age of Onset  . Alzheimer's disease Mother   . Heart failure Father   . Hyperlipidemia Father   . COPD Brother   . Depression Brother   . Hyperlipidemia Brother   . Arthritis Paternal Grandmother   . Breast cancer Neg Hx    No Known Allergies Current Outpatient Medications on File Prior to Visit  Medication Sig Dispense Refill  . diclofenac sodium (VOLTAREN) 1 % GEL Apply 2 g topically daily as needed.    Marland Kitchen estradiol (VIVELLE-DOT) 0.1 MG/24HR patch PLACE 1 PATCH ONTO SKIN TWICE A WEEK. TAKE OFF PREVIOUS BEFORE PUTTING ON NEW PATCH    . hydrocortisone cream 1 % Apply 1 application topically 2 (two) times daily. To affected areas as needed, do not exceed 14 days of continuous use 30 g 0  . venlafaxine XR (EFFEXOR-XR) 75 MG 24 hr capsule TAKE 1 CAPSULE BY MOUTH EVERY DAY 90 capsule 3  . cetirizine (ZYRTEC) 10 MG tablet Take 1 tablet (10 mg total) by mouth daily. (Patient not taking: Reported on 06/30/2020) 1 tablet 0   No current facility-administered medications on file prior to visit.     Review of Systems  Constitutional: Negative for activity change, appetite change, fatigue, fever and unexpected weight change.  HENT: Negative for congestion, ear pain, rhinorrhea, sinus pressure and sore throat.   Eyes: Negative for pain, redness and visual disturbance.  Respiratory: Negative for cough, shortness of breath and wheezing.   Cardiovascular: Negative for chest pain and palpitations.  Gastrointestinal: Negative for abdominal pain, blood in stool, constipation and diarrhea.  Endocrine: Negative for polydipsia and polyuria.  Genitourinary: Negative for dysuria, frequency and urgency.  Musculoskeletal: Negative for arthralgias, back pain and myalgias.  Skin: Positive for rash. Negative for pallor.  Allergic/Immunologic: Negative for environmental allergies.  Neurological: Negative for dizziness, syncope and headaches.  Hematological: Negative for adenopathy. Does not bruise/bleed easily.  Psychiatric/Behavioral: Negative for decreased concentration and dysphoric mood. The patient is not nervous/anxious.        Objective:   Physical Exam Constitutional:      General: She is not in acute distress.    Appearance: Normal appearance. She is well-developed and normal weight. She is not ill-appearing or diaphoretic.  HENT:     Head: Normocephalic and atraumatic.     Right Ear: Tympanic membrane, ear canal and external ear normal.     Left Ear: Tympanic membrane, ear canal and external ear normal.     Nose: Nose normal. No congestion.     Mouth/Throat:     Mouth: Mucous membranes are moist.     Pharynx: Oropharynx is clear. No posterior oropharyngeal erythema.  Eyes:     General: No scleral icterus.    Extraocular Movements: Extraocular movements intact.     Conjunctiva/sclera: Conjunctivae normal.     Pupils: Pupils are equal, round, and reactive to light.  Neck:     Thyroid: No thyromegaly.     Vascular: No carotid bruit or JVD.  Cardiovascular:     Rate and Rhythm: Normal rate and regular rhythm.     Pulses: Normal pulses.     Heart sounds: Normal heart sounds. No gallop.   Pulmonary:     Effort: Pulmonary effort is normal. No respiratory distress.     Breath sounds: Normal breath sounds. No wheezing.     Comments: Good air exch Chest:     Chest wall: No tenderness.  Abdominal:     General: Bowel sounds are normal. There is no distension or abdominal bruit.      Palpations: Abdomen is soft. There is no mass.     Tenderness: There is no abdominal tenderness.     Hernia: No hernia is present.  Genitourinary:    Comments: Breast and pelvic exam done by gyn Musculoskeletal:        General: No tenderness. Normal range of motion.     Cervical back: Normal range of motion and neck supple. No rigidity. No muscular tenderness.     Right lower leg: No edema.     Left lower leg: No edema.  Lymphadenopathy:     Cervical: No cervical adenopathy.  Skin:    General: Skin is warm and dry.     Coloration: Skin is not pale.     Findings: No erythema or rash.     Comments: Areas of vesicular rash in linear pattern on legs/dorsal feet, R wrist and both arms  Consistent with poison ivy dermatitis One area on L leg is weeping slightly (clear fluid)   Neurological:     Mental Status: She is alert. Mental status is at baseline.     Cranial Nerves: No cranial nerve deficit.     Motor: No abnormal muscle tone.     Coordination: Coordination normal.     Gait: Gait normal.     Deep Tendon Reflexes: Reflexes are normal and symmetric.  Psychiatric:        Mood and Affect: Mood normal.  Cognition and Memory: Memory normal.           Assessment & Plan:   Problem List Items Addressed This Visit      Musculoskeletal and Integument   Poison ivy dermatitis    Scattered areas- continues to break out  Encouraged to wash all items and clothing exposed (including her dog)  Prednisone 30 mg taper px (disc poss side eff)  inst to keep areas clean with soap and water Update if not starting to improve in a week or if worsening          Other   Anxiety disorder    Continues to do well with effexor xr 75 mg - will continue this  Reviewed stressors/ coping techniques/symptoms/ support sources/ tx options and side effects in detail today  Encouraged good self care      Routine general medical examination at a health care facility - Primary    Reviewed  health habits including diet and exercise and skin cancer prevention Reviewed appropriate screening tests for age  Also reviewed health mt list, fam hx and immunization status , as well as social and family history   See HPI Pt plans to get a flu shot in the fall  Interested in shingrix-will check on coverage utd gyn care -on HRT from gyn provider  covid immunized  Labs reviewed  Plan to try statin for cholesterol         Hyperlipidemia    LDL is up despite very good diet Disc goals for lipids and reasons to control them Rev last labs with pt Rev low sat fat diet in detail Suspect hereditary (also fam hx of vascular dz) Will try crestor 5 mg daily (inst to stop and call if side eff) and re check in approx 6 wk Then titrate if needed      Relevant Medications   rosuvastatin (CRESTOR) 5 MG tablet

## 2020-07-12 NOTE — Assessment & Plan Note (Signed)
LDL is up despite very good diet Disc goals for lipids and reasons to control them Rev last labs with pt Rev low sat fat diet in detail Suspect hereditary (also fam hx of vascular dz) Will try crestor 5 mg daily (inst to stop and call if side eff) and re check in approx 6 wk Then titrate if needed

## 2020-07-12 NOTE — Assessment & Plan Note (Signed)
Reviewed health habits including diet and exercise and skin cancer prevention Reviewed appropriate screening tests for age  Also reviewed health mt list, fam hx and immunization status , as well as social and family history   See HPI Pt plans to get a flu shot in the fall  Interested in shingrix-will check on coverage utd gyn care -on HRT from gyn provider  covid immunized  Labs reviewed  Plan to try statin for cholesterol

## 2020-07-12 NOTE — Assessment & Plan Note (Signed)
Continues to do well with effexor xr 75 mg - will continue this  Reviewed stressors/ coping techniques/symptoms/ support sources/ tx options and side effects in detail today  Encouraged good self care

## 2020-07-12 NOTE — Assessment & Plan Note (Signed)
Scattered areas- continues to break out  Encouraged to wash all items and clothing exposed (including her dog)  Prednisone 30 mg taper px (disc poss side eff)  inst to keep areas clean with soap and water Update if not starting to improve in a week or if worsening

## 2020-07-12 NOTE — Patient Instructions (Addendum)
Don't forget to get a flu shot in the fall    If you are interested in the shingles vaccine series (Shingrix), call your insurance or pharmacy to check on coverage and location it must be given.  If affordable - you can schedule it here or at your pharmacy depending on coverage   For cholesterol Avoid red meat/ fried foods/ egg yolks/ fatty breakfast meats/ butter, cheese and high fat dairy/ and shellfish Take the low dose crestor  Let's re check cholesterol in 6 weeks     Take prednisone as directed for poison ivy  Wash areas with soap and water until healed

## 2020-07-13 DIAGNOSIS — B078 Other viral warts: Secondary | ICD-10-CM | POA: Diagnosis not present

## 2020-07-13 DIAGNOSIS — L578 Other skin changes due to chronic exposure to nonionizing radiation: Secondary | ICD-10-CM | POA: Diagnosis not present

## 2020-07-13 DIAGNOSIS — L700 Acne vulgaris: Secondary | ICD-10-CM | POA: Diagnosis not present

## 2020-07-13 NOTE — Telephone Encounter (Signed)
Pt has already sent a pt message but pt left v/m also wanting to know if it matters the time of day pt takes the prednisone. Pt request cb.

## 2020-07-28 ENCOUNTER — Telehealth: Payer: Self-pay | Admitting: Family Medicine

## 2020-07-28 NOTE — Telephone Encounter (Signed)
Pt called wanting to schedule shingles  ok to schedule

## 2020-07-28 NOTE — Telephone Encounter (Signed)
As long as she knows what her coverage is, that is fine

## 2020-07-29 NOTE — Telephone Encounter (Signed)
I left a message on patient's voice mail to return my call.

## 2020-08-18 ENCOUNTER — Ambulatory Visit: Payer: BC Managed Care – PPO

## 2020-08-18 ENCOUNTER — Other Ambulatory Visit: Payer: Self-pay

## 2020-08-24 ENCOUNTER — Ambulatory Visit (INDEPENDENT_AMBULATORY_CARE_PROVIDER_SITE_OTHER): Payer: BC Managed Care – PPO | Admitting: *Deleted

## 2020-08-24 ENCOUNTER — Other Ambulatory Visit: Payer: Self-pay

## 2020-08-24 DIAGNOSIS — Z23 Encounter for immunization: Secondary | ICD-10-CM | POA: Diagnosis not present

## 2020-08-24 NOTE — Progress Notes (Signed)
Per orders of Dr. Milinda Antis, injection of Shingrix & Influenza given by Ileana Ladd. Patient tolerated injection well.

## 2020-08-26 ENCOUNTER — Other Ambulatory Visit: Payer: BC Managed Care – PPO

## 2020-08-27 ENCOUNTER — Other Ambulatory Visit: Payer: BC Managed Care – PPO

## 2020-09-10 ENCOUNTER — Other Ambulatory Visit: Payer: BC Managed Care – PPO

## 2020-09-20 ENCOUNTER — Telehealth: Payer: Self-pay | Admitting: Family Medicine

## 2020-09-20 MED ORDER — ROSUVASTATIN CALCIUM 5 MG PO TABS: 5.0000 mg | ORAL_TABLET | Freq: Every day | ORAL | 1 refills | Status: DC

## 2020-09-20 NOTE — Telephone Encounter (Signed)
No problem  I sent it to [facility]

## 2020-09-20 NOTE — Telephone Encounter (Signed)
Pt is coming in 10/14/2020 for labs but will need a refill of her Rosuvastatin prior to that so she can be on the medication when she has labs drawn.  Thank you!

## 2020-10-01 ENCOUNTER — Other Ambulatory Visit: Payer: BC Managed Care – PPO

## 2020-10-14 ENCOUNTER — Other Ambulatory Visit (INDEPENDENT_AMBULATORY_CARE_PROVIDER_SITE_OTHER): Payer: BC Managed Care – PPO

## 2020-10-14 ENCOUNTER — Other Ambulatory Visit: Payer: Self-pay

## 2020-10-14 DIAGNOSIS — E7800 Pure hypercholesterolemia, unspecified: Secondary | ICD-10-CM

## 2020-10-14 LAB — LIPID PANEL
Cholesterol: 164 mg/dL (ref 0–200)
HDL: 71.5 mg/dL (ref >39.00)
LDL Cholesterol: 77 mg/dL (ref 0–99)
NonHDL: 92.29
Total CHOL/HDL Ratio: 2
Triglycerides: 76 mg/dL (ref 0.0–149.0)
VLDL: 15.2 mg/dL (ref 0.0–40.0)

## 2020-10-14 LAB — ALT: ALT: 14 U/L (ref 0–35)

## 2020-10-14 LAB — AST: AST: 18 U/L (ref 0–37)

## 2020-12-23 ENCOUNTER — Telehealth: Payer: Self-pay

## 2020-12-23 ENCOUNTER — Ambulatory Visit: Payer: BC Managed Care – PPO

## 2020-12-23 NOTE — Telephone Encounter (Signed)
[facility] Night - Client Nonclinical Telephone Record AccessNurse Client [facility] Night - Client Client Site [facility] - Night Physician Roxy Manns - MD Contact Type Call Who Is Calling Patient / Member / Family / Caregiver Caller Name Keylah Darwish Caller Phone Number [phone] Patient Name Beth Nielsen Patient DOB Jun 21, 1961 Call Type Message Only Information Provided Reason for Call Request to [facility] Appointment Initial Comment Caller states she wanted to cancel her appointment for shingles vaccine. Additional Comment Caller states her appointment is at 400 p.m. today and she says she did want to feel sick at her meeting tomorrow. Caller states she will call and reschedule. Disp. Time Disposition Final User 12/23/2020 5:53:13 AM General Information Provided Yes Darrel Hoover Call Closed By: Darrel Hoover Transaction Date/Time: 12/23/2020 5:49:44 AM (ET)

## 2021-01-06 ENCOUNTER — Ambulatory Visit: Payer: BC Managed Care – PPO

## 2021-02-10 ENCOUNTER — Ambulatory Visit: Payer: BC Managed Care – PPO

## 2021-02-23 ENCOUNTER — Telehealth: Payer: Self-pay | Admitting: Family Medicine

## 2021-02-23 NOTE — Telephone Encounter (Signed)
Will route to PCP for review and also Carollee Herter, Charity fundraiser for input

## 2021-02-23 NOTE — Telephone Encounter (Signed)
It is sub optimal but but better to get it when she can than not at all

## 2021-02-23 NOTE — Telephone Encounter (Signed)
Pt called in needed to reschedule shingles vaccine. Her first vaccine  was in September.  she is now scheduled for 4/14 which puts her past the 6 month from the first.  Will this still be okay . Please advise

## 2021-02-24 ENCOUNTER — Ambulatory Visit: Payer: BC Managed Care – PPO

## 2021-02-24 NOTE — Telephone Encounter (Signed)
Yes, vaccine studies recommend to give second vaccine and not restart the 2 dose series.

## 2021-02-24 NOTE — Telephone Encounter (Signed)
Pt notified and she already has vaccine scheduled

## 2021-03-03 ENCOUNTER — Other Ambulatory Visit: Payer: Self-pay

## 2021-03-03 ENCOUNTER — Ambulatory Visit (INDEPENDENT_AMBULATORY_CARE_PROVIDER_SITE_OTHER): Payer: BC Managed Care – PPO | Admitting: *Deleted

## 2021-03-03 DIAGNOSIS — Z23 Encounter for immunization: Secondary | ICD-10-CM | POA: Diagnosis not present

## 2021-03-17 ENCOUNTER — Ambulatory Visit: Payer: BC Managed Care – PPO

## 2021-04-04 ENCOUNTER — Telehealth: Payer: Self-pay

## 2021-04-04 NOTE — Telephone Encounter (Signed)
Pt called to schedule an in office visit--- pt c/o right ear pain, denies loss of hearing and intermittent nasal congestion x 1-2 weeks... pt denies headache, fever, chills, cough etc... Appt scheduled for Wed at 3pm... please advise if appropriate for in office or would you prefer mychart virtual... pt is aware that she may not be able to come in the office

## 2021-04-04 NOTE — Telephone Encounter (Signed)
That is ok as long as no other symptoms. Please make her aware , if anything new we may need to do a virtual visit.

## 2021-04-04 NOTE — Telephone Encounter (Signed)
Pt notified of Dr. Tower's comments  

## 2021-04-06 ENCOUNTER — Telehealth (INDEPENDENT_AMBULATORY_CARE_PROVIDER_SITE_OTHER): Payer: BC Managed Care – PPO | Admitting: Family Medicine

## 2021-04-06 ENCOUNTER — Telehealth: Payer: Self-pay | Admitting: *Deleted

## 2021-04-06 ENCOUNTER — Encounter: Payer: Self-pay | Admitting: Family Medicine

## 2021-04-06 ENCOUNTER — Other Ambulatory Visit: Payer: Self-pay

## 2021-04-06 DIAGNOSIS — H6981 Other specified disorders of Eustachian tube, right ear: Secondary | ICD-10-CM | POA: Diagnosis not present

## 2021-04-06 DIAGNOSIS — J301 Allergic rhinitis due to pollen: Secondary | ICD-10-CM | POA: Diagnosis not present

## 2021-04-06 DIAGNOSIS — H6980 Other specified disorders of Eustachian tube, unspecified ear: Secondary | ICD-10-CM | POA: Insufficient documentation

## 2021-04-06 DIAGNOSIS — J309 Allergic rhinitis, unspecified: Secondary | ICD-10-CM | POA: Insufficient documentation

## 2021-04-06 DIAGNOSIS — Z20822 Contact with and (suspected) exposure to covid-19: Secondary | ICD-10-CM | POA: Diagnosis not present

## 2021-04-06 MED ORDER — FLUTICASONE PROPIONATE 50 MCG/ACT NA SUSP
2.0000 | Freq: Every day | NASAL | 6 refills | Status: DC
Start: 2021-04-06 — End: 2021-07-18

## 2021-04-06 NOTE — Assessment & Plan Note (Signed)
Suspect intermittent R ear discomfort is due to this  flonase px Watch for pain/worse or dec hearing Update if not starting to improve in a week or if worsening

## 2021-04-06 NOTE — Telephone Encounter (Signed)
Patient called stating that she has an appointment scheduled with Dr. Milinda Antis today at 3:00. Patient stated that her husband was on a business trip and came home Sunday night. Patient stated that her husband was not feeling well yesterday, so he did a home covid test that came back positive. Patient stated that she has had heaviness in her ears and allergy symptoms for about a week and a half. Patient stated that she has had allergies but her symptoms have felt different this week. 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 Patient stated that she did a home covid test after her husband did one and it was negative. Patient was advised that she may have tested too soon. Patient was advised that we can not bring her into the office with her symptoms and exposure. Patient wants to know how she would do a virtual with her having ear problems. Patient stated that she needs guidance on how long she should stay out of work. Patient had a lot of questions about her exposure to covid. Patient was advised that she should probably have her visit changed to a virtual. Patient was advised that this message will go back to Dr. Milinda Antis for her review.Patient was given ER precautions and she verbalized understanding.

## 2021-04-06 NOTE — Telephone Encounter (Signed)
Pt said she had more questions regarding being exposed to covid, and since we can do covid test here she wants to keep her appt with PCP this afternoon. Pt will discuss her concerns with PCP directly at virtual visit. Pt declined going to UC. FYI to PCP

## 2021-04-06 NOTE — Assessment & Plan Note (Signed)
Seasonal, spring summer  With congestion and intermittent ear discomfort (suspect ETD) Recommend zyrtec 10 mg daily  Sent px for flonase ns daily through season  inst to call if worse/ear pain or not improving

## 2021-04-06 NOTE — Assessment & Plan Note (Signed)
Immunized and boosted Husband has mild covid  Neg home test today  Watch for symptoms and wear mask  inst to call if any symptoms  Recommend pcr test if she becomes symptomatic Can test at home ever few days

## 2021-04-06 NOTE — Patient Instructions (Signed)
Drink fluids  Take zyrtec 10 mg daily and flonase daily for allergies and ear discomfort  Watch for signs/symptoms of covid 19 and keep [place] updated Repeat your home test in a few days Follow up if worse or new symptoms develop (go to ER if severe or short of breath)

## 2021-04-06 NOTE — Telephone Encounter (Signed)
Aware, I will see her then  

## 2021-04-06 NOTE — Telephone Encounter (Signed)
Agree she may need to re test for covid and we can order that as well If she is seen vitrually Correct, I can look in her ear. Please explain that we cannot see her in office due to exposure to covid.   With ear pain , it may be better for her to go to an urgent care.

## 2021-04-06 NOTE — Progress Notes (Signed)
Virtual Visit via Video Note  I connected with Beth Nielsen on 04/06/21 at  3:00 PM EDT by a video enabled telemedicine application and verified that I am speaking with the correct person using two identifiers.  Location: Patient: home Provider: office   I discussed the limitations of evaluation and management by telemedicine and the availability of in person appointments. The patient expressed understanding and agreed to proceed.  Parties involved in encounter  Patient: Beth Nielsen  Provider:  Roxy Manns MD   History of Present Illness: Pt presents for R ear discomfort and congestion and recent covid exposure  Last 10 d has had ear pain on the R It comes and goes (worse in the am) Puts hydrocort cream on  Takes advil   With seasonal allergies- congestion / worse with grass pollen also Stuffy but not runny A little bit of sneezing today Also had a cold back in march (home test neg)   No fever  No body aches or chills   No cough   No n/v/d   Works in schools   Husband did home test this week / tested positive   She tested herself - yesterday afternoon neg    covid immunized with booster   Otc: Zyrtec-will start back  Has not used any nasal sprays  Patient Active Problem List   Diagnosis Date Noted  . Allergic rhinitis 04/06/2021  . Close exposure to COVID-19 virus 04/06/2021  . ETD (eustachian tube dysfunction) 04/06/2021  . Poison ivy dermatitis 07/12/2020  . Hyperlipidemia 07/12/2020  . Routine general medical examination at a health care facility 07/04/2020  . Sore throat 06/30/2020  . Anal itching 12/08/2019  . Ear itching 12/08/2019  . Anxiety disorder 06/30/2019  . Screening mammogram, encounter for 06/30/2019  . Hearing decreased 06/30/2019   Past Medical History:  Diagnosis Date  . History of chickenpox   . History of UTI    Past Surgical History:  Procedure Laterality Date  . BLADDER SURGERY  2018   removal of benign growth  .  ECTOPIC PREGNANCY SURGERY  2003  . HYSTEROTOMY  2018  . TONSILLECTOMY  1972  . WISDOM TOOTH EXTRACTION     age 42   Social History   Tobacco Use  . Smoking status: Never Smoker  . Smokeless tobacco: Never Used  Substance Use Topics  . Alcohol use: Yes    Comment: glass of wine a day  . Drug use: Never   Family History  Problem Relation Age of Onset  . Alzheimer's disease Mother   . Heart failure Father   . Hyperlipidemia Father   . COPD Brother   . Depression Brother   . Hyperlipidemia Brother   . Arthritis Paternal Grandmother   . Breast cancer Neg Hx    No Known Allergies Current Outpatient Medications on File Prior to Visit  Medication Sig Dispense Refill  . cetirizine (ZYRTEC) 10 MG tablet Take 1 tablet (10 mg total) by mouth daily. 1 tablet 0  . diclofenac sodium (VOLTAREN) 1 % GEL Apply 2 g topically daily as needed.    Marland Kitchen estradiol (VIVELLE-DOT) 0.1 MG/24HR patch PLACE 1 PATCH ONTO SKIN TWICE A WEEK. TAKE OFF PREVIOUS BEFORE PUTTING ON NEW PATCH    . hydrocortisone cream 1 % Apply 1 application topically 2 (two) times daily. To affected areas as needed, do not exceed 14 days of continuous use 30 g 0  . rosuvastatin (CRESTOR) 5 MG tablet Take 1 tablet (5 mg total) by mouth  daily. 30 tablet 1  . venlafaxine XR (EFFEXOR-XR) 75 MG 24 hr capsule TAKE 1 CAPSULE BY MOUTH EVERY DAY 90 capsule 3   No current facility-administered medications on file prior to visit.   Review of Systems  Constitutional: Negative for chills, fever and malaise/fatigue.  HENT: Positive for congestion and ear pain. Negative for ear discharge, sinus pain, sore throat and tinnitus.   Eyes: Negative for blurred vision, discharge and redness.  Respiratory: Negative for cough, shortness of breath and stridor.   Cardiovascular: Negative for chest pain, palpitations and leg swelling.  Gastrointestinal: Negative for abdominal pain, diarrhea, nausea and vomiting.  Musculoskeletal: Negative for myalgias.   Skin: Negative for rash.  Neurological: Negative for dizziness and headaches.    Observations/Objective: Patient appears well, in no distress Weight is baseline  No facial swelling or asymmetry Normal voice-not hoarse and no slurred speech (occ sniffles) No obvious tremor or mobility impairment Moving neck and UEs normally Able to hear the call well  No cough or shortness of breath during interview  Talkative and mentally sharp with no cognitive changes No skin changes on face or neck , no rash or pallor Affect is normal    Assessment and Plan: Problem List Items Addressed This Visit      Respiratory   Allergic rhinitis    Seasonal, spring summer  With congestion and intermittent ear discomfort (suspect ETD) Recommend zyrtec 10 mg daily  Sent px for flonase ns daily through season  inst to call if worse/ear pain or not improving        Nervous and Auditory   ETD (eustachian tube dysfunction)    Suspect intermittent R ear discomfort is due to this  flonase px Watch for pain/worse or dec hearing Update if not starting to improve in a week or if worsening          Other   Close exposure to COVID-19 virus    Immunized and boosted Husband has mild covid  Neg home test today  Watch for symptoms and wear mask  inst to call if any symptoms  Recommend pcr test if she becomes symptomatic Can test at home ever few days          Follow Up Instructions:   Drink fluids  Take zyrtec 10 mg daily and flonase daily for allergies and ear discomfort  Watch for signs/symptoms of covid 19 and keep [place] updated Repeat your home test in a few days Follow up if worse or new symptoms develop (go to ER if severe or short of breath)  I discussed the assessment and treatment plan with the patient. The patient was provided an opportunity to ask questions and all were answered. The patient agreed with the plan and demonstrated an understanding of the instructions.   The patient was  advised to call back or seek an in-person evaluation if the symptoms worsen or if the condition fails to improve as anticipated.     Roxy Manns, MD

## 2021-04-08 ENCOUNTER — Other Ambulatory Visit: Payer: BC Managed Care – PPO

## 2021-04-08 ENCOUNTER — Encounter: Payer: Self-pay | Admitting: Family Medicine

## 2021-04-08 ENCOUNTER — Telehealth: Payer: Self-pay | Admitting: *Deleted

## 2021-04-08 DIAGNOSIS — J069 Acute upper respiratory infection, unspecified: Secondary | ICD-10-CM

## 2021-04-08 NOTE — Telephone Encounter (Signed)
Addressed through urgency

## 2021-04-08 NOTE — Telephone Encounter (Signed)
I ordered the covid test

## 2021-04-08 NOTE — Telephone Encounter (Signed)
Monitor symptoms /temp and can take otc meds for symptoms like mucinex DM, tylenol as needed.  If severe (sob/severe cough) go to er and let us know.  If she worsens can schedule appt for virtual sat clinic Thanks for letting us know

## 2021-04-08 NOTE — Telephone Encounter (Signed)
Pt sent a message saying that she has tested positive for Covid this AM using rapid test. Pt is asking is there anything she needs to do or medication to take to lesson symptoms and for her overall health   Cell: [phone]

## 2021-04-08 NOTE — Telephone Encounter (Signed)
Noted thank you

## 2021-04-08 NOTE — Telephone Encounter (Signed)
Pt notified of Dr. Royden Purl comments. Pt said she spoke with the nurse at her job and she said she needs the official test for documentation. covid test appt scheduled and will route to PCP and also doctor here to order covid test per [facility] since pt has already had a virtual visit with PCP

## 2021-04-09 LAB — SARS-COV-2, NAA 2 DAY TAT

## 2021-04-09 LAB — NOVEL CORONAVIRUS, NAA: SARS-CoV-2, NAA: DETECTED — AB

## 2021-04-10 ENCOUNTER — Other Ambulatory Visit: Payer: Self-pay | Admitting: Physician Assistant

## 2021-04-10 MED ORDER — NIRMATRELVIR/RITONAVIR (PAXLOVID)TABLET: 3.0000 | ORAL_TABLET | Freq: Two times a day (BID) | ORAL | 0 refills | Status: AC

## 2021-04-10 NOTE — Progress Notes (Signed)
Outpatient Oral COVID Treatment Note  I connected with Beth Nielsen on 04/10/2021/5:56 PM by telephone and verified that I am speaking with the correct person using two identifiers.  I discussed the limitations, risks, security, and privacy concerns of performing an evaluation and management service by telephone and the availability of in person appointments. I also discussed with the patient that there may be a patient responsible charge related to this service. The patient expressed understanding and agreed to proceed.  Patient location: home Provider location: office   Diagnosis: COVID-19 infection  Purpose of visit: Discussion of potential use of Molnupiravir or Paxlovid, a new treatment for mild to moderate COVID-19 viral infection in non-hospitalized patients.   Subjective: Patient is a 60 y.o. female who has been diagnosed with COVID 19 viral infection.  Their symptoms began on 5/6 with nasal congestion and body aches .    Past Medical History:  Diagnosis Date  . History of chickenpox   . History of UTI     No Known Allergies   Current Outpatient Medications:  .  cetirizine (ZYRTEC) 10 MG tablet, Take 1 tablet (10 mg total) by mouth daily., Disp: 1 tablet, Rfl: 0 .  diclofenac sodium (VOLTAREN) 1 % GEL, Apply 2 g topically daily as needed., Disp: , Rfl:  .  estradiol (VIVELLE-DOT) 0.1 MG/24HR patch, PLACE 1 PATCH ONTO SKIN TWICE A WEEK. TAKE OFF PREVIOUS BEFORE PUTTING ON NEW PATCH, Disp: , Rfl:  .  fluticasone (FLONASE) 50 MCG/ACT nasal spray, Place 2 sprays into both nostrils daily., Disp: 16 g, Rfl: 6 .  hydrocortisone cream 1 %, Apply 1 application topically 2 (two) times daily. To affected areas as needed, do not exceed 14 days of continuous use, Disp: 30 g, Rfl: 0 .  rosuvastatin (CRESTOR) 5 MG tablet, Take 1 tablet (5 mg total) by mouth daily., Disp: 30 tablet, Rfl: 1 .  venlafaxine XR (EFFEXOR-XR) 75 MG 24 hr capsule, TAKE 1 CAPSULE BY MOUTH EVERY DAY, Disp: 90 capsule,  Rfl: 3  Objective: Patient sound congested.  They are in no apparent distress.  Breathing is non labored.  Mood and behavior are normal.  Laboratory Data:  Recent Results (from the past 2160 hour(s))  Novel Coronavirus, NAA ([facility])     Status: Abnormal   Collection Time: 04/08/21 12:00 AM   Specimen: Nasopharyngeal(NP) swabs in vial transport medium   Nasopharynge  Result Value Ref Range   SARS-CoV-2, NAA Detected (A) Not Detected    Comment: Patients who have a positive COVID-19 test result may now have treatment options. Treatment options are available for patients with mild to moderate symptoms and for hospitalized patients. Visit our website at [email or web address] for resources and information. This nucleic acid amplification test was developed and its performance characteristics determined by [employer]. Nucleic acid amplification tests include RT-PCR and TMA. This test has not been FDA cleared or approved. This test has been authorized by FDA under an Emergency Use Authorization (EUA). This test is only authorized for the duration of time the declaration that circumstances exist justifying the authorization of the emergency use of in vitro diagnostic tests for detection of SARS-CoV-2 virus and/or diagnosis of COVID-19 infection under section 564(b)(1) of the Act, 21 U.S.C. 967ELF-8(B) (1), unless the authorization is terminated or revoked sooner. When diagnostic testing is negativ e, the possibility of a false negative result should be considered in the context of a patient's recent exposures and the presence of clinical signs and symptoms consistent with COVID-19. An individual  without symptoms of COVID-19 and who is not shedding SARS-CoV-2 virus would expect to have a negative (not detected) result in this assay.   SARS-COV-2, NAA 2 DAY TAT     Status: None   Collection Time: 04/08/21 12:00 AM   Nasopharynge  Result Value Ref Range   SARS-CoV-2,  NAA 2 DAY TAT Performed      Assessment: 60 y.o. female with mild/moderate COVID 19 viral infection diagnosed on 5/7 at high risk for progression to severe COVID 19.  Plan:  This patient is a 60 y.o. female that meets the following criteria for Emergency Use Authorization of: Paxlovid 1. Age >12 yr AND > 40 kg 2. SARS-COV-2 positive test 3. Symptom onset < 5 days 4. Mild-to-moderate COVID disease with high risk for severe progression to hospitalization or death  I have spoken and communicated the following to the patient or parent/caregiver regarding: 1. Paxlovid is an unapproved drug that is authorized for use under an Emergency Use Authorization.  2. There are no adequate, approved, available products for the treatment of COVID-19 in adults who have mild-to-moderate COVID-19 and are at high risk for progressing to severe COVID-19, including hospitalization or death. 3. Other therapeutics are currently authorized. For additional information on all products authorized for treatment or prevention of COVID-19, please see [email or web address]-[email or web address]  4. There are benefits and risks of taking this treatment as outlined in the "Fact Sheet for Patients and Caregivers."  5. "Fact Sheet for Patients and Caregivers" was reviewed with patient. A hard copy will be provided to patient from pharmacy prior to the patient receiving treatment. 6. Patients should continue to self-isolate and use infection control measures (e.g., wear mask, isolate, social distance, avoid sharing personal items, clean and disinfect "high touch" surfaces, and frequent handwashing) according to CDC guidelines.  7. The patient or parent/caregiver has the option to accept or refuse treatment. 8. Patient medication history was reviewed for potential drug interactions:Interaction with home meds: hold flonase and crestor 9. Patient's GFR was  calculated to be >60, and they were therefore prescribed Normal dose (GFR>60) - nirmatrelvir 150mg  tab (2 tablet) by mouth twice daily AND ritonavir 100mg  tab (1 tablet) by mouth twice daily   After reviewing above information with the patient, the patient agrees to receive Paxlovid.  Follow up instructions:    . Take prescription BID x 5 days as directed . Reach out to pharmacist for counseling on medication if desired . For concerns regarding further COVID symptoms please follow up with your PCP or urgent care . For urgent or life-threatening issues, seek care at your local emergency department  The patient was provided an opportunity to ask questions, and all were answered. The patient agreed with the plan and demonstrated an understanding of the instructions.   [facility] and opted to [facility].  The patient was advised to call their PCP or seek an in-person evaluation if the symptoms worsen or if the condition fails to improve as anticipated.   I provided 15 minutes of non face-to-face telephone visit time during this encounter, and > 50% was spent counseling as documented under my assessment & plan.  , PA-C 04/10/2021 /5:56 PM

## 2021-05-16 ENCOUNTER — Other Ambulatory Visit: Payer: Self-pay | Admitting: Family Medicine

## 2021-05-16 ENCOUNTER — Telehealth: Payer: Self-pay | Admitting: Family Medicine

## 2021-05-16 MED ORDER — AMOXICILLIN-POT CLAVULANATE 875-125 MG PO TABS: 1.0000 | ORAL_TABLET | Freq: Two times a day (BID) | ORAL | 0 refills | Status: DC

## 2021-05-16 NOTE — Telephone Encounter (Signed)
Pt notified Rxs sent to pharmacy and advised of Dr. Royden Purl comments  She did advise me that the dog is up to date on rabies vaccines.   Pt will keep appt tomorrow for a provider to look at it   FYI to PCP and Dr. Para March who will see her tomorrow

## 2021-05-16 NOTE — Telephone Encounter (Signed)
Patient was bitten on Left thigh last night by neighbors dog. Unsure if it has had its Rabies vaccine.   Pt states that she does have a small puncture wound about 1/4", denies redness expanding from the wound greater than 1/4" diameter, no bleeding, heat to area or swelling. Site if very sore.   Spoke with Dr Milinda Antis and she states that she can send in an Rx for Augmentin to start today and then she will keep her appt scheduled with Dr Para March tomorrow at 4pm  Per Dr Milinda Antis, if no rabies vaccine may need to go to ER (they are the ones to treat for rabies)    [facility] [address]  She will call back and let us know about the Rabies vaccine.

## 2021-05-16 NOTE — Telephone Encounter (Signed)
Noted. Thanks.

## 2021-05-16 NOTE — Telephone Encounter (Signed)
I sent in augmentin -start it now  Keep very clean with soap and water  If severe pain or streaking redness/ more severe swelling to to ER  Let us know about rabies status of the dog when she knows  If healthy dog -it should be quarantined and watched carefully for 10 days

## 2021-05-17 ENCOUNTER — Encounter: Payer: Self-pay | Admitting: Family Medicine

## 2021-05-17 ENCOUNTER — Ambulatory Visit: Payer: BC Managed Care – PPO | Admitting: Family Medicine

## 2021-05-17 ENCOUNTER — Other Ambulatory Visit: Payer: Self-pay

## 2021-05-17 DIAGNOSIS — L03119 Cellulitis of unspecified part of limb: Secondary | ICD-10-CM

## 2021-05-17 MED ORDER — DOXYCYCLINE HYCLATE 100 MG PO TABS
100.0000 mg | ORAL_TABLET | Freq: Two times a day (BID) | ORAL | 0 refills | Status: DC
Start: 2021-05-17 — End: 2021-07-18

## 2021-05-17 NOTE — Patient Instructions (Signed)
Add on doxycycline.  Continue augmentin.  Update [place] tomorrow.  We need to know about size of reddish area, pain and fevers.  Take care.  Glad to see you.

## 2021-05-17 NOTE — Progress Notes (Signed)
This visit occurred during the SARS-CoV-2 public health emergency.  Safety protocols were in place, including screening questions prior to the visit, additional usage of staff PPE, and extensive cleaning of exam room while observing appropriate contact time as indicated for disinfecting solutions.  Dog bite.  Neighbor's dog bit vs clawed her.  Dog came out of the house as she was talking to the neighbor, attacked her dog.  Patient was pulling the dogs apart and got injured in L thigh.  Dog was vaccinated for rabies and neighbor can monitor.  No known events with the dog o/w.  Already on augmentin.  No other skin lesions.  No fevers.    Tetanus up to date 2019.    Nad Ncat Rrr ctab L thigh with local redness 8.5 x6cm.  Central skin disruption with what appears to be a small scab centrally.  No fluctuant mass.  No discharge.

## 2021-05-18 ENCOUNTER — Encounter: Payer: Self-pay | Admitting: Family Medicine

## 2021-05-18 DIAGNOSIS — L0390 Cellulitis, unspecified: Secondary | ICD-10-CM | POA: Insufficient documentation

## 2021-05-18 NOTE — Assessment & Plan Note (Signed)
After dog bite versus call.  Add on doxycycline.  Continue Augmentin.  Border more.  Routine cautions given to patient.  I asked her to update me the following day.  See follow-up notes.  Okay for outpatient follow-up.

## 2021-05-19 ENCOUNTER — Telehealth: Payer: Self-pay | Admitting: Family Medicine

## 2021-05-19 NOTE — Telephone Encounter (Signed)
Pt called and left a message on triage line asking why Dr Para March felt she needed to go to the ER. I called her back to say he was basing that on her most recent MyChart note and pictures and her concerns. She said that it may actually be looking better. I told her that if she did not go to the ER to at least got to an UC. She asked about being seen here tomorrow and I advised her as of right now we did not have any availability. She can call in the am to see if we had something, but that Dr Para March felt it needed to be seen today.

## 2021-05-19 NOTE — Telephone Encounter (Signed)
See my mychart message.  Please call pt to make sure she got the message.  Needs to go to ER re: cellulitis.  Thanks.

## 2021-05-19 NOTE — Telephone Encounter (Signed)
Called and spoke with patient and advised to go to ER for eval for cellulitis. Patient did see mychart message. Patient was very hesitant to go to ER; urged her to go today. Patient agrees and will hopefully do so.

## 2021-06-01 ENCOUNTER — Other Ambulatory Visit: Payer: Self-pay | Admitting: Family Medicine

## 2021-06-01 DIAGNOSIS — Z1231 Encounter for screening mammogram for malignant neoplasm of breast: Secondary | ICD-10-CM

## 2021-07-12 ENCOUNTER — Telehealth: Payer: Self-pay | Admitting: Family Medicine

## 2021-07-12 ENCOUNTER — Other Ambulatory Visit: Payer: Self-pay

## 2021-07-12 ENCOUNTER — Other Ambulatory Visit (INDEPENDENT_AMBULATORY_CARE_PROVIDER_SITE_OTHER): Payer: BC Managed Care – PPO

## 2021-07-12 DIAGNOSIS — E7800 Pure hypercholesterolemia, unspecified: Secondary | ICD-10-CM

## 2021-07-12 DIAGNOSIS — Z0000 Encounter for general adult medical examination without abnormal findings: Secondary | ICD-10-CM | POA: Diagnosis not present

## 2021-07-12 LAB — CBC WITH DIFFERENTIAL/PLATELET
Basophils Absolute: 0.1 10*3/uL (ref 0.0–0.1)
Basophils Relative: 1.4 % (ref 0.0–3.0)
Eosinophils Absolute: 0.2 10*3/uL (ref 0.0–0.7)
Eosinophils Relative: 2.5 % (ref 0.0–5.0)
HCT: 39.8 % (ref 36.0–46.0)
Hemoglobin: 13.3 g/dL (ref 12.0–15.0)
Lymphocytes Relative: 28.9 % (ref 12.0–46.0)
Lymphs Abs: 2.3 10*3/uL (ref 0.7–4.0)
MCHC: 33.3 g/dL (ref 30.0–36.0)
MCV: 92.6 fl (ref 78.0–100.0)
Monocytes Absolute: 0.6 10*3/uL (ref 0.1–1.0)
Monocytes Relative: 7.2 % (ref 3.0–12.0)
Neutro Abs: 4.8 10*3/uL (ref 1.4–7.7)
Neutrophils Relative %: 60 % (ref 43.0–77.0)
RBC: 4.3 Mil/uL (ref 3.87–5.11)
RDW: 13.3 % (ref 11.5–15.5)
WBC: 8 10*3/uL (ref 4.0–10.5)

## 2021-07-12 LAB — COMPREHENSIVE METABOLIC PANEL
ALT: 14 U/L (ref 0–35)
AST: 18 U/L (ref 0–37)
Albumin: 4.1 g/dL (ref 3.5–5.2)
Alkaline Phosphatase: 45 U/L (ref 39–117)
BUN: 12 mg/dL (ref 6–23)
CO2: 29 mEq/L (ref 19–32)
Calcium: 9.1 mg/dL (ref 8.4–10.5)
Chloride: 103 mEq/L (ref 96–112)
Creatinine, Ser: 0.77 mg/dL (ref 0.40–1.20)
GFR: 85.13 mL/min (ref >60.00)
Glucose, Bld: 88 mg/dL (ref 70–99)
Potassium: 4 mEq/L (ref 3.5–5.1)
Sodium: 137 mEq/L (ref 135–145)
Total Bilirubin: 0.8 mg/dL (ref 0.2–1.2)
Total Protein: 6.8 g/dL (ref 6.0–8.3)

## 2021-07-12 LAB — LIPID PANEL
Cholesterol: 189 mg/dL (ref 0–200)
HDL: 70.2 mg/dL (ref >39.00)
LDL Cholesterol: 102 mg/dL — ABNORMAL HIGH (ref 0–99)
NonHDL: 119.23
Total CHOL/HDL Ratio: 3
Triglycerides: 84 mg/dL (ref 0.0–149.0)
VLDL: 16.8 mg/dL (ref 0.0–40.0)

## 2021-07-12 LAB — TSH: TSH: 2.2 u[IU]/mL (ref 0.35–5.50)

## 2021-07-12 NOTE — Telephone Encounter (Signed)
-----   Message from Alvina Chou sent at 07/07/2021  2:26 PM EDT ----- Regarding: Lab orders for Tuesday, 8.9.22 Patient is scheduled for CPX labs, please order future labs, Thanks , Camelia Eng

## 2021-07-12 NOTE — Telephone Encounter (Signed)
-----   Message from Aquilla Solian, RT sent at 06/27/2021 10:33 AM EDT ----- Regarding: Lab Orders for Wednesday 8.10.2022 Please place lab orders for Wednesday 8.10.2022, office visit for physical on Monday 8.15.2022 Thank you, Jones Bales RT(R)

## 2021-07-13 ENCOUNTER — Other Ambulatory Visit: Payer: BC Managed Care – PPO

## 2021-07-14 ENCOUNTER — Ambulatory Visit
Admission: RE | Admit: 2021-07-14 | Discharge: 2021-07-14 | Disposition: A | Discharge status: Home / Self Care | Payer: BC Managed Care – PPO | Source: Ambulatory Visit | Attending: Family Medicine | Admitting: Family Medicine

## 2021-07-14 ENCOUNTER — Other Ambulatory Visit: Payer: Self-pay

## 2021-07-14 DIAGNOSIS — Z1231 Encounter for screening mammogram for malignant neoplasm of breast: Secondary | ICD-10-CM | POA: Insufficient documentation

## 2021-07-18 ENCOUNTER — Encounter: Payer: Self-pay | Admitting: Family Medicine

## 2021-07-18 ENCOUNTER — Ambulatory Visit (INDEPENDENT_AMBULATORY_CARE_PROVIDER_SITE_OTHER): Payer: BC Managed Care – PPO | Admitting: Family Medicine

## 2021-07-18 ENCOUNTER — Other Ambulatory Visit: Payer: Self-pay

## 2021-07-18 VITALS — BP 120/72 | HR 68 | Temp 98.2°F | Ht 64.0 in | Wt 147.1 lb

## 2021-07-18 DIAGNOSIS — Z0000 Encounter for general adult medical examination without abnormal findings: Secondary | ICD-10-CM

## 2021-07-18 DIAGNOSIS — I8393 Asymptomatic varicose veins of bilateral lower extremities: Secondary | ICD-10-CM | POA: Insufficient documentation

## 2021-07-18 DIAGNOSIS — F411 Generalized anxiety disorder: Secondary | ICD-10-CM | POA: Diagnosis not present

## 2021-07-18 DIAGNOSIS — E7800 Pure hypercholesterolemia, unspecified: Secondary | ICD-10-CM | POA: Diagnosis not present

## 2021-07-18 DIAGNOSIS — L84 Corns and callosities: Secondary | ICD-10-CM | POA: Insufficient documentation

## 2021-07-18 MED ORDER — VENLAFAXINE HCL ER 75 MG PO CP24: ORAL_CAPSULE | ORAL | 3 refills | Status: DC

## 2021-07-18 MED ORDER — ROSUVASTATIN CALCIUM 5 MG PO TABS: 5.0000 mg | ORAL_TABLET | Freq: Every day | ORAL | 3 refills | Status: DC

## 2021-07-18 MED ORDER — FLUTICASONE PROPIONATE 50 MCG/ACT NA SUSP: 2.0000 | Freq: Every day | NASAL | 6 refills | Status: DC

## 2021-07-18 NOTE — Assessment & Plan Note (Signed)
Reviewed health habits including diet and exercise and skin cancer prevention Reviewed appropriate screening tests for age  Also reviewed health mt list, fam hx and immunization status , as well as social and family history   See HPI Labs reviewed  covid immunized Encouraged flu shot in the fall  Colonoscopy utd 2/14 with 10 y recall  Mammogram nl -recent utd gyn care/uses HRT Mood is good Commended good exercise habits

## 2021-07-18 NOTE — Assessment & Plan Note (Signed)
Disc goals for lipids and reasons to control them Rev last labs with pt Rev low sat fat diet in detail Taking crestor 5 mg daily  LDL up to 102 No missed doses Will work on low trans and sat fat diet and continue to monitor  Likely hereditary

## 2021-07-18 NOTE — Assessment & Plan Note (Signed)
bilat feet- under first metatarsal head  No ulceration or pain  Disc soaking and using foot file gently  Consider podiatry later if needed

## 2021-07-18 NOTE — Patient Instructions (Addendum)
Keep up the good work with diet and exercise and self care   Labs look good   For cholesterol  Avoid red meat/ fried foods/ egg yolks/ fatty breakfast meats/ butter, cheese and high fat dairy/ and shellfish    I will work on a vein clinic referral this week so you will get a call

## 2021-07-18 NOTE — Assessment & Plan Note (Signed)
Areas of spider veins noted on bilat LE Pt is interested in ref to vein clinic in the future

## 2021-07-18 NOTE — Progress Notes (Signed)
Subjective:    Patient ID: Beth Nielsen, female    DOB: October 14, 1961, 60 y.o.   MRN: [id number]  This visit occurred during the SARS-CoV-2 public health emergency.  Safety protocols were in place, including screening questions prior to the visit, additional usage of staff PPE, and extensive cleaning of exam room while observing appropriate contact time as indicated for disinfecting solutions.   HPI Here for health maintenance exam and to review chronic medical problems    Wt Readings from Last 3 Encounters:  07/18/21 147 lb 2 oz (66.7 kg)  05/17/21 146 lb (66.2 kg)  07/12/20 150 lb 1.6 oz (68.1 kg)   25.25 kg/m  Had a relaxing summer  Traveled   Feeling good  Taking care of herself   Exercise - some golf and walking  2 times per week does a class for flexibility  Very active also    Covid immunized with booster Flu shot-fall  Td 3/19  Shingrix-up to date   Colonoscopy 2/14  Mammogram 8/22 Self breast exam -no lumps   Gyn care -sees gyn Has had a hysterectomy  On HRT vivelle dot  0.1  Had her breast exam    Mood/anxiety Effexor xr 75 mg daily  Fine  Tried to go off of it-family disagreed  She had some irritability w/o it and prefers to be on it    Hyperlipidemia Lab Results  Component Value Date   CHOL 189 07/12/2021   CHOL 164 10/14/2020   CHOL 238 (H) 07/05/2020   Lab Results  Component Value Date   HDL 70.20 07/12/2021   HDL 71.50 10/14/2020   HDL 59.80 07/05/2020   Lab Results  Component Value Date   LDLCALC 102 (H) 07/12/2021   LDLCALC 77 10/14/2020   LDLCALC 146 (H) 07/05/2020   Lab Results  Component Value Date   TRIG 84.0 07/12/2021   TRIG 76.0 10/14/2020   TRIG 161.0 (H) 07/05/2020   Lab Results  Component Value Date   CHOLHDL 3 07/12/2021   CHOLHDL 2 10/14/2020   CHOLHDL 4 07/05/2020   No results found for: LDLDIRECT Taking crestor 5 mg daily  Hereditary  Eats very healthy  Watching carbs as well as fats    Other labs    Results for orders placed or performed in visit on 07/12/21  TSH  Result Value Ref Range   TSH 2.20 0.35 - 5.50 uIU/mL  Lipid panel  Result Value Ref Range   Cholesterol 189 0 - 200 mg/dL   Triglycerides 65.9 0.0 - 149.0 mg/dL   HDL 93.57 >01.77 mg/dL   VLDL 93.9 0.0 - 03.0 mg/dL   LDL Cholesterol 092 (H) 0 - 99 mg/dL   Total CHOL/HDL Ratio 3    NonHDL 119.23   Comprehensive metabolic panel  Result Value Ref Range   Sodium 137 135 - 145 mEq/L   Potassium 4.0 3.5 - 5.1 mEq/L   Chloride 103 96 - 112 mEq/L   CO2 29 19 - 32 mEq/L   Glucose, Bld 88 70 - 99 mg/dL   BUN 12 6 - 23 mg/dL   Creatinine, Ser 3.30 0.40 - 1.20 mg/dL   Total Bilirubin 0.8 0.2 - 1.2 mg/dL   Alkaline Phosphatase 45 39 - 117 U/L   AST 18 0 - 37 U/L   ALT 14 0 - 35 U/L   Total Protein 6.8 6.0 - 8.3 g/dL   Albumin 4.1 3.5 - 5.2 g/dL   GFR 07.62 >26.33 mL/min   Calcium  9.1 8.4 - 10.5 mg/dL  CBC with Differential/Platelet  Result Value Ref Range   WBC 8.0 4.0 - 10.5 K/uL   RBC 4.30 3.87 - 5.11 Mil/uL   Hemoglobin 13.3 12.0 - 15.0 g/dL   HCT 28.4 13.2 - 44.0 %   MCV 92.6 78.0 - 100.0 fl   MCHC 33.3 30.0 - 36.0 g/dL   RDW 10.2 72.5 - 36.6 %   Neutrophils Relative % 60.0 43.0 - 77.0 %   Lymphocytes Relative 28.9 12.0 - 46.0 %   Monocytes Relative 7.2 3.0 - 12.0 %   Eosinophils Relative 2.5 0.0 - 5.0 %   Basophils Relative 1.4 0.0 - 3.0 %   Neutro Abs 4.8 1.4 - 7.7 K/uL   Lymphs Abs 2.3 0.7 - 4.0 K/uL   Monocytes Absolute 0.6 0.1 - 1.0 K/uL   Eosinophils Absolute 0.2 0.0 - 0.7 K/uL   Basophils Absolute 0.1 0.0 - 0.1 K/uL    Patient Active Problem List   Diagnosis Date Noted   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/18/2021   Callus of foot 07/18/2021   Allergic rhinitis 04/06/2021   Close exposure to COVID-19 virus 04/06/2021   ETD (eustachian tube dysfunction) 04/06/2021   Hyperlipidemia 07/12/2020   Routine general medical examination at a health care facility 07/04/2020   Anxiety disorder  06/30/2019   Hearing decreased 06/30/2019   Past Medical History:  Diagnosis Date   History of chickenpox    History of UTI    Past Surgical History:  Procedure Laterality Date   BLADDER SURGERY  2018   removal of benign growth   ECTOPIC PREGNANCY SURGERY  2003   HYSTEROTOMY  2018   TONSILLECTOMY  1972   WISDOM TOOTH EXTRACTION     age 85   Social History   Tobacco Use   Smoking status: Never   Smokeless tobacco: Never  Substance Use Topics   Alcohol use: Yes    Comment: glass of wine a day   Drug use: Never   Family History  Problem Relation Age of Onset   Alzheimer's disease Mother    Heart failure Father    Hyperlipidemia Father    COPD Brother    Depression Brother    Hyperlipidemia Brother    Arthritis Paternal Grandmother    Breast cancer Neg Hx    No Known Allergies Current Outpatient Medications on File Prior to Visit  Medication Sig Dispense Refill   cetirizine (ZYRTEC) 10 MG tablet Take 1 tablet (10 mg total) by mouth daily. 1 tablet 0   diclofenac sodium (VOLTAREN) 1 % GEL Apply 2 g topically daily as needed.     estradiol (VIVELLE-DOT) 0.1 MG/24HR patch PLACE 1 PATCH ONTO SKIN TWICE A WEEK. TAKE OFF PREVIOUS BEFORE PUTTING ON NEW PATCH     No current facility-administered medications on file prior to visit.      Review of Systems  Constitutional:  Negative for activity change, appetite change, fatigue, fever and unexpected weight change.  HENT:  Negative for congestion, ear pain, rhinorrhea, sinus pressure and sore throat.   Eyes:  Negative for pain, redness and visual disturbance.  Respiratory:  Negative for cough, shortness of breath and wheezing.   Cardiovascular:  Negative for chest pain and palpitations.       Spider veins on legs  Not painful   Gastrointestinal:  Negative for abdominal pain, blood in stool, constipation and diarrhea.  Endocrine: Negative for polydipsia and polyuria.  Genitourinary:  Negative for dysuria, frequency  and  urgency.  Musculoskeletal:  Negative for arthralgias, back pain and myalgias.  Skin:  Negative for pallor and rash.       Calluses on feet  Bothersome   Allergic/Immunologic: Negative for environmental allergies.  Neurological:  Negative for dizziness, syncope and headaches.  Hematological:  Negative for adenopathy. Does not bruise/bleed easily.  Psychiatric/Behavioral:  Negative for decreased concentration and dysphoric mood. The patient is nervous/anxious.       Objective:   Physical Exam Constitutional:      General: She is not in acute distress.    Appearance: Normal appearance. She is well-developed and normal weight. She is not ill-appearing or diaphoretic.  HENT:     Head: Normocephalic and atraumatic.     Right Ear: Tympanic membrane, ear canal and external ear normal.     Left Ear: Tympanic membrane, ear canal and external ear normal.     Nose: Nose normal. No congestion.     Mouth/Throat:     Mouth: Mucous membranes are moist.     Pharynx: Oropharynx is clear. No posterior oropharyngeal erythema.  Eyes:     General: No scleral icterus.    Extraocular Movements: Extraocular movements intact.     Conjunctiva/sclera: Conjunctivae normal.     Pupils: Pupils are equal, round, and reactive to light.  Neck:     Thyroid: No thyromegaly.     Vascular: No carotid bruit or JVD.  Cardiovascular:     Rate and Rhythm: Normal rate and regular rhythm.     Pulses: Normal pulses.     Heart sounds: Normal heart sounds.    No gallop.     Comments: Scattered spider varicose veins bilat LE Not tender No edema  Pulmonary:     Effort: Pulmonary effort is normal. No respiratory distress.     Breath sounds: Normal breath sounds. No wheezing.     Comments: Good air exch Chest:     Chest wall: No tenderness.  Abdominal:     General: Bowel sounds are normal. There is no distension or abdominal bruit.     Palpations: Abdomen is soft. There is no mass.     Tenderness: There is no  abdominal tenderness.     Hernia: No hernia is present.  Genitourinary:    Comments: Breast and pelvic exam done by gyn provider  Musculoskeletal:        General: No tenderness. Normal range of motion.     Cervical back: Normal range of motion and neck supple. No rigidity. No muscular tenderness.     Right lower leg: No edema.     Left lower leg: No edema.     Comments: No kyphosis  Good posture   Lymphadenopathy:     Cervical: No cervical adenopathy.  Skin:    General: Skin is warm and dry.     Coloration: Skin is not pale.     Findings: No erythema or rash.     Comments: Solar lentigines diffusely  Mild calluses w/o ulceration under metatarsal heads bilaterally  Neurological:     Mental Status: She is alert. Mental status is at baseline.     Cranial Nerves: No cranial nerve deficit.     Motor: No abnormal muscle tone.     Coordination: Coordination normal.     Gait: Gait normal.     Deep Tendon Reflexes: Reflexes are normal and symmetric. Reflexes normal.  Psychiatric:        Mood and Affect: Mood normal.  Cognition and Memory: Cognition and memory normal.          Assessment & Plan:   Problem List Items Addressed This Visit       Other   Anxiety disorder    Doing well with effexor xr 75 mg  Did not tolerate attempt to wean- became more anxious and irritable  Disc importance of self care Good exercise habits       Relevant Medications   venlafaxine XR (EFFEXOR-XR) 75 MG 24 hr capsule   Routine general medical examination at a health care facility - Primary    Reviewed health habits including diet and exercise and skin cancer prevention Reviewed appropriate screening tests for age  Also reviewed health mt list, fam hx and immunization status , as well as social and family history   See HPI Labs reviewed  covid immunized Encouraged flu shot in the fall  Colonoscopy utd 2/14 with 10 y recall  Mammogram nl -recent utd gyn care/uses HRT Mood is  good Commended good exercise habits       Hyperlipidemia    Disc goals for lipids and reasons to control them Rev last labs with pt Rev low sat fat diet in detail Taking crestor 5 mg daily  LDL up to 102 No missed doses Will work on low trans and sat fat diet and continue to monitor  Likely hereditary       Relevant Medications   rosuvastatin (CRESTOR) 5 MG tablet

## 2021-07-18 NOTE — Assessment & Plan Note (Signed)
Doing well with effexor xr 75 mg  Did not tolerate attempt to wean- became more anxious and irritable  Disc importance of self care Good exercise habits

## 2021-07-21 ENCOUNTER — Telehealth: Payer: Self-pay | Admitting: Family Medicine

## 2021-07-21 DIAGNOSIS — I8393 Asymptomatic varicose veins of bilateral lower extremities: Secondary | ICD-10-CM

## 2021-07-21 NOTE — Telephone Encounter (Signed)
Vein clinic referral

## 2021-12-07 ENCOUNTER — Telehealth: Payer: Self-pay | Admitting: *Deleted

## 2021-12-07 ENCOUNTER — Ambulatory Visit
Admission: RE | Admit: 2021-12-07 | Discharge: 2021-12-07 | Disposition: A | Discharge status: Home / Self Care | Payer: BC Managed Care – PPO | Source: Ambulatory Visit | Attending: Family Medicine | Admitting: Family Medicine

## 2021-12-07 ENCOUNTER — Ambulatory Visit: Payer: BC Managed Care – PPO

## 2021-12-07 ENCOUNTER — Other Ambulatory Visit: Payer: Self-pay | Admitting: Family Medicine

## 2021-12-07 ENCOUNTER — Other Ambulatory Visit: Payer: Self-pay

## 2021-12-07 ENCOUNTER — Other Ambulatory Visit: Payer: BC Managed Care – PPO

## 2021-12-07 ENCOUNTER — Encounter: Payer: Self-pay | Admitting: Family Medicine

## 2021-12-07 ENCOUNTER — Telehealth: Payer: BC Managed Care – PPO | Admitting: Family Medicine

## 2021-12-07 DIAGNOSIS — J069 Acute upper respiratory infection, unspecified: Secondary | ICD-10-CM | POA: Diagnosis not present

## 2021-12-07 LAB — POC INFLUENZA A&B (BINAX/QUICKVUE)
Influenza A, POC: NEGATIVE
Influenza B, POC: NEGATIVE

## 2021-12-07 MED ORDER — AMOXICILLIN-POT CLAVULANATE 875-125 MG PO TABS: 1.0000 | ORAL_TABLET | Freq: Two times a day (BID) | ORAL | 0 refills | Status: DC

## 2021-12-07 MED ORDER — AZITHROMYCIN 250 MG PO TABS: ORAL_TABLET | ORAL | 0 refills | Status: DC

## 2021-12-07 NOTE — Progress Notes (Signed)
Virtual Visit via Video Note  I connected with Beth Nielsen on 12/07/21 at 10:30 AM EST by a video enabled telemedicine application and verified that I am speaking with the correct person using two identifiers.  Location: Patient: home Provider: office   I discussed the limitations of evaluation and management by telemedicine and the availability of in person appointments. The patient expressed understanding and agreed to proceed.  Parties involved in encounter  Patient: Beth Nielsen  Provider:  Roxy Manns MD    History of Present Illness: Pt presents with cough/congestion and fever   Symptoms started 8 days ago  Started with a cough  Several days later fever and was in bed all day Friday  A little better Saturday but a lot of cough  Monday -stayed out of work, then Monday night felt worse again  Yesterday lousy also  Temp -max 101.9 -took advil Prod cough- mucous/ thick with dark yellow color  Nose is not running but is congested  Body aches and head ache   Feels a little sob  No wheeze at all   Covid test is negative    She was in [place] recently   Otc:  Advil Tea  Immunized for covid  Had covid in may  Had bivalent booster in oct Had her flu shot at work    Lab Results  Component Value Date   CREATININE 0.77 07/12/2021   BUN 12 07/12/2021   NA 137 07/12/2021   K 4.0 07/12/2021   CL 103 07/12/2021   CO2 29 07/12/2021   Patient Active Problem List   Diagnosis Date Noted   URI with cough and congestion 12/07/2021   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/18/2021   Callus of foot 07/18/2021   Allergic rhinitis 04/06/2021   ETD (eustachian tube dysfunction) 04/06/2021   Hyperlipidemia 07/12/2020   Routine general medical examination at a health care facility 07/04/2020   Anxiety disorder 06/30/2019   Hearing decreased 06/30/2019   Past Medical History:  Diagnosis Date   History of chickenpox    History of UTI    Past Surgical History:   Procedure Laterality Date   BLADDER SURGERY  2018   removal of benign growth   ECTOPIC PREGNANCY SURGERY  2003   HYSTEROTOMY  2018   TONSILLECTOMY  1972   WISDOM TOOTH EXTRACTION     age 61   Social History   Tobacco Use   Smoking status: Never   Smokeless tobacco: Never  Substance Use Topics   Alcohol use: Yes    Comment: glass of wine a day   Drug use: Never   Family History  Problem Relation Age of Onset   Alzheimer's disease Mother    Heart failure Father    Hyperlipidemia Father    COPD Brother    Depression Brother    Hyperlipidemia Brother    Arthritis Paternal Grandmother    Breast cancer Neg Hx    No Known Allergies Current Outpatient Medications on File Prior to Visit  Medication Sig Dispense Refill   cetirizine (ZYRTEC) 10 MG tablet Take 1 tablet (10 mg total) by mouth daily. 1 tablet 0   diclofenac sodium (VOLTAREN) 1 % GEL Apply 2 g topically daily as needed.     estradiol (VIVELLE-DOT) 0.1 MG/24HR patch PLACE 1 PATCH ONTO SKIN TWICE A WEEK. TAKE OFF PREVIOUS BEFORE PUTTING ON NEW PATCH     fluticasone (FLONASE) 50 MCG/ACT nasal spray Place 2 sprays into both nostrils daily. 16 g  6   rosuvastatin (CRESTOR) 5 MG tablet Take 1 tablet (5 mg total) by mouth daily. 90 tablet 3   venlafaxine XR (EFFEXOR-XR) 75 MG 24 hr capsule TAKE 1 CAPSULE BY MOUTH EVERY DAY 90 capsule 3   No current facility-administered medications on file prior to visit.   Review of Systems  Constitutional:  Positive for chills, diaphoresis, fever and malaise/fatigue.  HENT:  Positive for congestion, ear pain and sore throat. Negative for sinus pain.   Eyes:  Negative for blurred vision, discharge and redness.  Respiratory:  Positive for cough, sputum production and shortness of breath. Negative for wheezing and stridor.   Cardiovascular:  Negative for chest pain, palpitations and leg swelling.  Gastrointestinal:  Negative for abdominal pain, diarrhea, nausea and vomiting.   Musculoskeletal:  Negative for myalgias.  Skin:  Negative for rash.  Neurological:  Positive for headaches. Negative for dizziness.   Observations/Objective: Patient appears well, in no distress, but tired Weight is baseline  No facial swelling or asymmetry Normal voice-not hoarse and no slurred speech No obvious tremor or mobility impairment Moving neck and UEs normally Able to hear the call well  No wheeze or shortness of breath during interview  Cough is junky sounding and productive and frequent  Talkative and mentally sharp with no cognitive changes No skin changes on face or neck , no rash or pallor Affect is normal    Assessment and Plan: Problem List Items Addressed This Visit       Respiratory   URI with cough and congestion    Day 8, was better then worse  Fever  Cough/productive of yellow mucous  No wheeze but feels sob/exercise intolerance Discussed symptom care -see AVS Fluids/rest  ER precautions discussed  Set up for flu and covid swabs and cxr stat  Plan to follow         Follow Up Instructions: Drink fluids and rest  We will set up a drive through appt. For swabs (flu and covid) Also for a chest xray in [place]  Will contact you with results as soon as we have them   Drink fluids and rest  mucinex DM is good for cough and congestion  Nasal saline for congestion as needed  Tylenol or advil (with food)  for fever or pain or headache  Please alert [place] if symptoms worsen (if severe or short of breath please go to the ER)    I discussed the assessment and treatment plan with the patient. The patient was provided an opportunity to ask questions and all were answered. The patient agreed with the plan and demonstrated an understanding of the instructions.   The patient was advised to call back or seek an in-person evaluation if the symptoms worsen or if the condition fails to improve as anticipated.     Roxy Manns, MD

## 2021-12-07 NOTE — Telephone Encounter (Signed)
Ruby in radiology called to let Dr. Milinda Antis know that the results are in Epic for this patient's chest x-ray.

## 2021-12-07 NOTE — Patient Instructions (Signed)
Drink fluids and rest  We will set up a drive through appt. For swabs (flu and covid) Also for a chest xray in [place]  Will contact you with results as soon as we have them   Drink fluids and rest  mucinex DM is good for cough and congestion  Nasal saline for congestion as needed  Tylenol or advil (with food)  for fever or pain or headache  Please alert [place] if symptoms worsen (if severe or short of breath please go to the ER)

## 2021-12-07 NOTE — Telephone Encounter (Signed)
Cxr consistent with pna on the R side   I pended augmentin and azithromycin to go to pharmacy of choice-please send  Take both as directed  Take with food and get started asap  Alert [place] if symptoms get any worse If worsened sob please go to the ER F/u here on Friday or Monday if able please for in person re check

## 2021-12-07 NOTE — Telephone Encounter (Signed)
Pt notified of xray results and Dr. Royden Purl comments, Rx sent to pharmacy. F/u appt scheduled and ER precautions given

## 2021-12-07 NOTE — Assessment & Plan Note (Signed)
Day 8, was better then worse  Fever  Cough/productive of yellow mucous  No wheeze but feels sob/exercise intolerance Discussed symptom care -see AVS Fluids/rest  ER precautions discussed  Set up for flu and covid swabs and cxr stat  Plan to follow

## 2021-12-08 LAB — SARS-COV-2, NAA 2 DAY TAT

## 2021-12-08 LAB — NOVEL CORONAVIRUS, NAA: SARS-CoV-2, NAA: NOT DETECTED

## 2021-12-12 ENCOUNTER — Ambulatory Visit: Payer: BC Managed Care – PPO | Admitting: Family Medicine

## 2021-12-12 ENCOUNTER — Encounter: Payer: Self-pay | Admitting: Family Medicine

## 2021-12-12 ENCOUNTER — Other Ambulatory Visit: Payer: Self-pay

## 2021-12-12 DIAGNOSIS — J189 Pneumonia, unspecified organism: Secondary | ICD-10-CM | POA: Diagnosis not present

## 2021-12-12 NOTE — Progress Notes (Signed)
Subjective:    Patient ID: Beth Nielsen, female    DOB: 1961-04-04, 61 y.o.   MRN: [id number]  This visit occurred during the SARS-CoV-2 public health emergency.  Safety protocols were in place, including screening questions prior to the visit, additional usage of staff PPE, and extensive cleaning of exam room while observing appropriate contact time as indicated for disinfecting solutions.   HPI Pt presents for f/u of pneumonia   Wt Readings from Last 3 Encounters:  12/12/21 152 lb 4 oz (69.1 kg)  07/18/21 147 lb 2 oz (66.7 kg)  05/17/21 146 lb (66.2 kg)   26.13 kg/m  Had visit on 1/4 for cough and congestion and fever   Testing was done-neg for covid and flu   DG Chest 2 View  Result Date: 12/07/2021 CLINICAL DATA:  productive cough and fever with mild shortness of breath EXAM: CHEST - 2 VIEW COMPARISON:  None. FINDINGS: Right basilar airspace opacities. No visible pleural effusions or pneumothorax. Cardiomediastinal silhouette is within normal limits. No evidence of acute osseous abnormality. IMPRESSION: Right basilar airspace opacities, concerning for pneumonia. Followup PA and lateral chest X-ray is recommended in 3-4 weeks following trial of antibiotic therapy to ensure resolution and exclude underlying malignancy. These results will be called to the ordering clinician or representative by the Radiologist Assistant, and communication documented in the PACS or [employer]. Electronically Signed   By: Feliberto Harts M.D.   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   Callus of foot 07/18/2021   Allergic rhinitis 04/06/2021   ETD (eustachian tube dysfunction) 04/06/2021   Hyperlipidemia 07/12/2020   Routine general medical examination at a health care facility 07/04/2020   Anxiety disorder 06/30/2019   Hearing decreased 06/30/2019   Past Medical History:  Diagnosis Date   History of chickenpox    History of UTI    Past Surgical History:  Procedure Laterality Date   BLADDER SURGERY  2018   removal of benign growth   ECTOPIC PREGNANCY SURGERY  2003   HYSTEROTOMY  2018   TONSILLECTOMY  1972   WISDOM TOOTH EXTRACTION     age 49   Social History   Tobacco Use   Smoking status: Never   Smokeless tobacco: Never  Substance Use Topics   Alcohol use: Yes    Comment: glass of wine a day   Drug use: Never   Family History  Problem Relation Age of Onset   Alzheimer's disease Mother    Heart failure Father    Hyperlipidemia Father    COPD Brother    Depression Brother    Hyperlipidemia Brother    Arthritis Paternal Grandmother    Breast cancer Neg Hx    No Known Allergies Current Outpatient Medications on File Prior to Visit  Medication Sig Dispense Refill  amoxicillin-clavulanate (AUGMENTIN) 875-125 MG tablet Take 1 tablet by mouth 2 (two) times daily. 14 tablet 0   cetirizine (ZYRTEC) 10 MG tablet Take 1 tablet (10 mg total) by mouth daily. 1 tablet 0   diclofenac sodium (VOLTAREN) 1 % GEL Apply 2 g topically daily as needed.     estradiol (VIVELLE-DOT) 0.1 MG/24HR patch PLACE 1 PATCH ONTO SKIN TWICE A WEEK. TAKE OFF PREVIOUS BEFORE PUTTING ON NEW PATCH     fluticasone (FLONASE) 50 MCG/ACT nasal spray Place 2 sprays into both nostrils daily. 16 g 6   rosuvastatin (CRESTOR) 5 MG tablet Take 1 tablet (5 mg total) by mouth daily. 90 tablet 3   venlafaxine XR (EFFEXOR-XR) 75  MG 24 hr capsule TAKE 1 CAPSULE BY MOUTH EVERY DAY 90 capsule 3   No current facility-administered medications on file prior to visit.    Review of Systems  Constitutional:  Positive for fatigue. Negative for activity change, appetite change, fever and unexpected weight change.  HENT:  Negative for congestion, rhinorrhea, sore throat and trouble swallowing.   Eyes:  Negative for pain, redness, itching and visual disturbance.  Respiratory:  Negative for cough, chest tightness, shortness of breath, wheezing and stridor.   Cardiovascular:  Negative for chest pain and palpitations.  Gastrointestinal:  Positive for diarrhea. Negative for abdominal pain, blood in stool, constipation and nausea.  Endocrine: Negative for cold intolerance, heat intolerance, polydipsia and polyuria.  Genitourinary:  Negative for difficulty urinating, dysuria, frequency and urgency.  Musculoskeletal:  Negative for arthralgias, joint swelling and myalgias.  Skin:  Negative for pallor and rash.  Neurological:  Negative for dizziness, tremors, weakness, numbness and headaches.  Hematological:  Negative for adenopathy. Does not bruise/bleed easily.  Psychiatric/Behavioral:  Negative for decreased concentration and dysphoric mood. The patient is not nervous/anxious.       Objective:   Physical Exam Constitutional:      General: She is not in acute distress.    Appearance: Normal appearance. She is well-developed and normal weight. She is not ill-appearing or diaphoretic.  HENT:     Head: Normocephalic and atraumatic.  Eyes:     Conjunctiva/sclera: Conjunctivae normal.     Pupils: Pupils are equal, round, and reactive to light.  Neck:     Thyroid: No thyromegaly.     Vascular: No carotid bruit or JVD.  Cardiovascular:     Rate and Rhythm: Normal rate and regular rhythm.     Heart sounds: Normal heart sounds.    No gallop.  Pulmonary:     Effort: Pulmonary effort is normal. No respiratory distress.     Breath  sounds: Normal breath sounds. No stridor. No wheezing, rhonchi or rales.     Comments: Good air exch Occ dry cough No wheeze even on forced expiration  No prolonged expiratory phase  Chest:     Chest wall: No tenderness.  Abdominal:     General: Abdomen is flat. There is no abdominal bruit.     Palpations: There is no mass.  Musculoskeletal:     Cervical back: Normal range of motion and neck supple.     Right lower leg: No edema.     Left lower leg: No edema.  Lymphadenopathy:     Cervical: No cervical adenopathy.  Skin:    General: Skin is warm and dry.     Coloration: Skin is not pale.     Findings: No rash.  Neurological:     Mental Status: She is alert.  Coordination: Coordination normal.     Deep Tendon Reflexes: Reflexes are normal and symmetric. Reflexes normal.  Psychiatric:        Mood and Affect: Mood normal.          Assessment & Plan:   Problem List Items Addressed This Visit       Respiratory   CAP (community acquired pneumonia)    R basilar on xray  Much improved with augmentin and zpak and tolerating well  Nl pulse ox  Some fatigue as expected  Reassuring exam  Will continue to monitor  Plan f/u in 1 mo for visit , repeat cxr and pneumonia vaccine  Update if not improving further in a week or if worsening  ER precautions discussed

## 2021-12-12 NOTE — Telephone Encounter (Signed)
Please sign and close encounter if completed and patient has been notified.

## 2021-12-12 NOTE — Assessment & Plan Note (Signed)
R basilar on xray  Much improved with augmentin and zpak and tolerating well  Nl pulse ox  Some fatigue as expected  Reassuring exam  Will continue to monitor  Plan f/u in 1 mo for visit , repeat cxr and pneumonia vaccine  Update if not improving further in a week or if worsening  ER precautions discussed

## 2021-12-12 NOTE — Patient Instructions (Signed)
Take care of yourself  Finish the antibiotics as planned  Drink fluids and rest  Return to full activity gradually  If any set backs let us know - if short of breath worse cough or any fever   Follow up in 1 month  We will do a chest xray and do a pneumonia vaccine

## 2022-01-09 ENCOUNTER — Ambulatory Visit: Payer: BC Managed Care – PPO | Admitting: Family Medicine

## 2022-01-22 ENCOUNTER — Encounter: Payer: Self-pay | Admitting: Family Medicine

## 2022-01-22 DIAGNOSIS — J189 Pneumonia, unspecified organism: Secondary | ICD-10-CM

## 2022-01-23 NOTE — Telephone Encounter (Signed)
Pt called returning a call  °

## 2022-01-23 NOTE — Telephone Encounter (Signed)
Pt notified of Dr. Royden Purl comments. Pt wants to come this Friday for her xray. Please put xray order in

## 2022-01-27 ENCOUNTER — Other Ambulatory Visit: Payer: BC Managed Care – PPO

## 2022-02-01 ENCOUNTER — Ambulatory Visit: Payer: BC Managed Care – PPO | Admitting: Family Medicine

## 2022-02-09 ENCOUNTER — Ambulatory Visit (INDEPENDENT_AMBULATORY_CARE_PROVIDER_SITE_OTHER)
Admission: RE | Admit: 2022-02-09 | Discharge: 2022-02-09 | Disposition: A | Discharge status: Home / Self Care | Payer: BC Managed Care – PPO | Source: Ambulatory Visit | Attending: Family Medicine | Admitting: Family Medicine

## 2022-02-09 DIAGNOSIS — J189 Pneumonia, unspecified organism: Secondary | ICD-10-CM | POA: Diagnosis not present

## 2022-04-11 ENCOUNTER — Telehealth: Payer: Self-pay | Admitting: Family Medicine

## 2022-04-11 NOTE — Telephone Encounter (Signed)
I can't right this moment  ?

## 2022-04-11 NOTE — Telephone Encounter (Signed)
Pt is calling wanting to know if Dr Milinda Antis would take on her daughter as a new patient, her daughter is 71. Call back is [phone].  ?

## 2022-05-25 ENCOUNTER — Telehealth: Payer: Self-pay

## 2022-05-25 NOTE — Telephone Encounter (Signed)
Called and spoke with patient gave her this information. ?

## 2022-05-25 NOTE — Telephone Encounter (Signed)
Patient wants to know when she will be due for her next mammogram.

## 2022-06-26 IMAGING — MG MM DIGITAL SCREENING BILAT W/ TOMO AND CAD
6 of 10 series · 6 of 30 positions shown · non-contrast
Comparison: Previous exam(s).

CLINICAL DATA: Screening.

EXAM:
DIGITAL SCREENING BILATERAL MAMMOGRAM WITH TOMOSYNTHESIS AND CAD
TECHNIQUE: Bilateral screening digital craniocaudal and mediolateral oblique
mammograms were obtained. Bilateral screening digital breast
tomosynthesis was performed. The images were evaluated with
computer-aided detection.

[R MLO synth-2D]
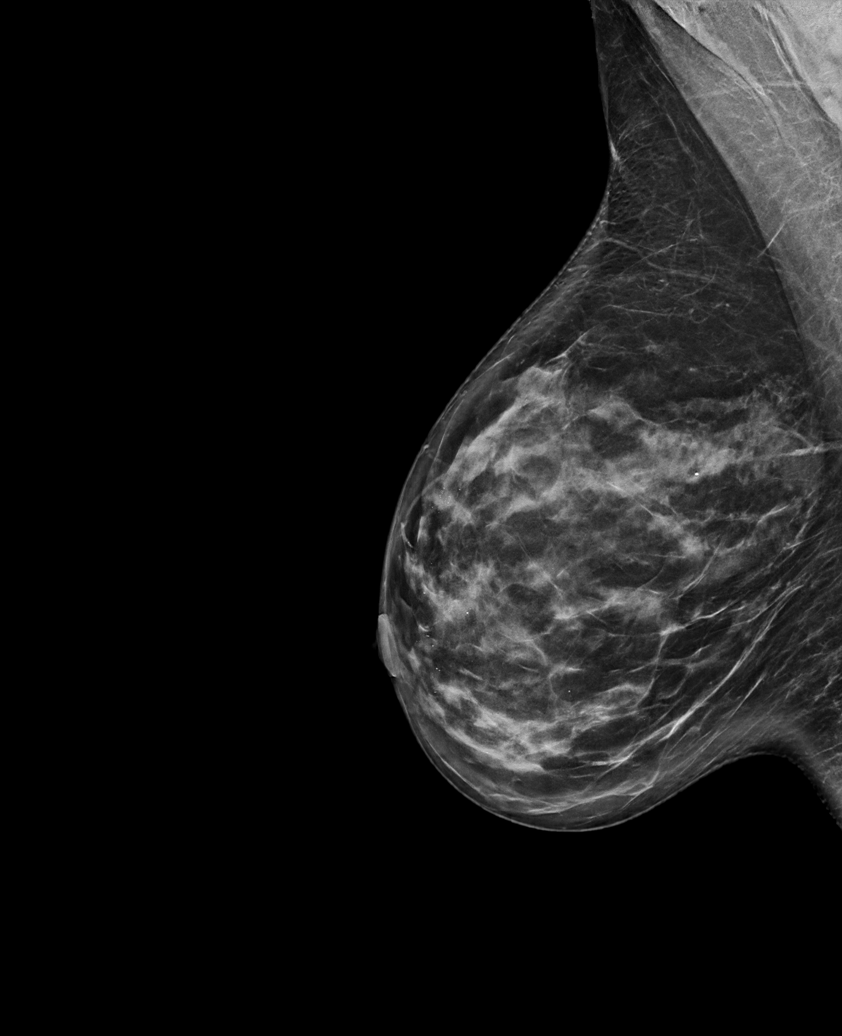

[R CC synth-2D (1 of 2)]
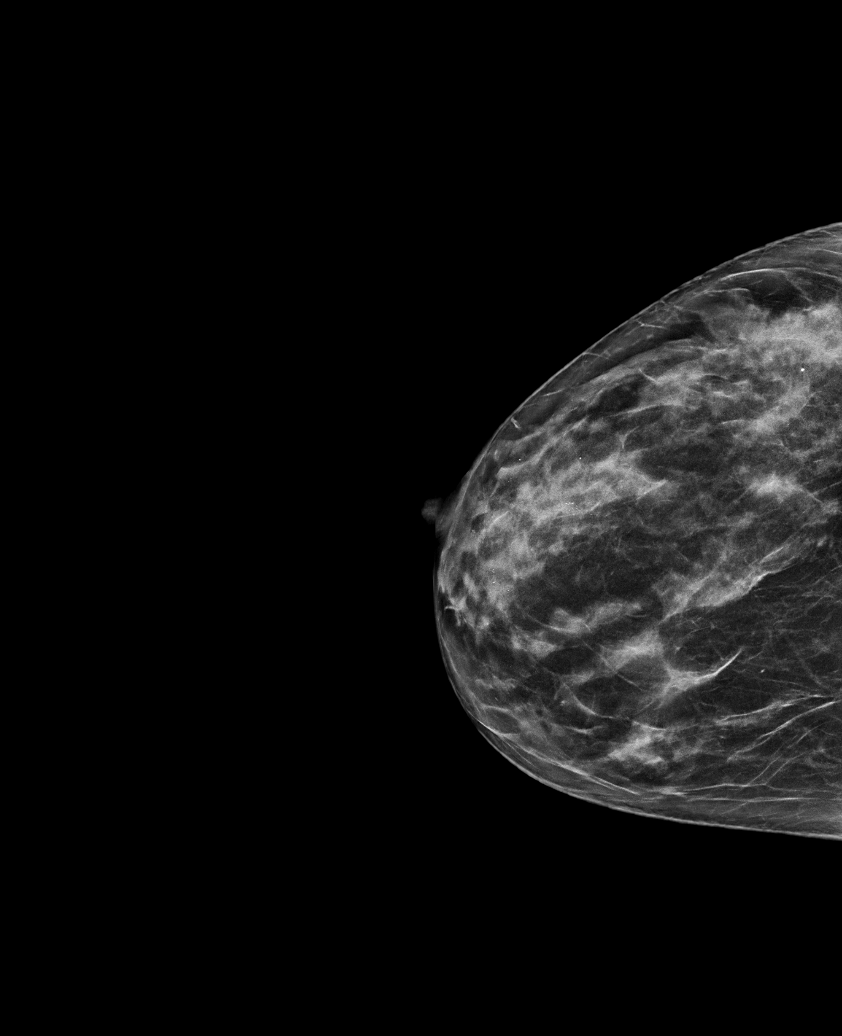

[L MLO synth-2D]
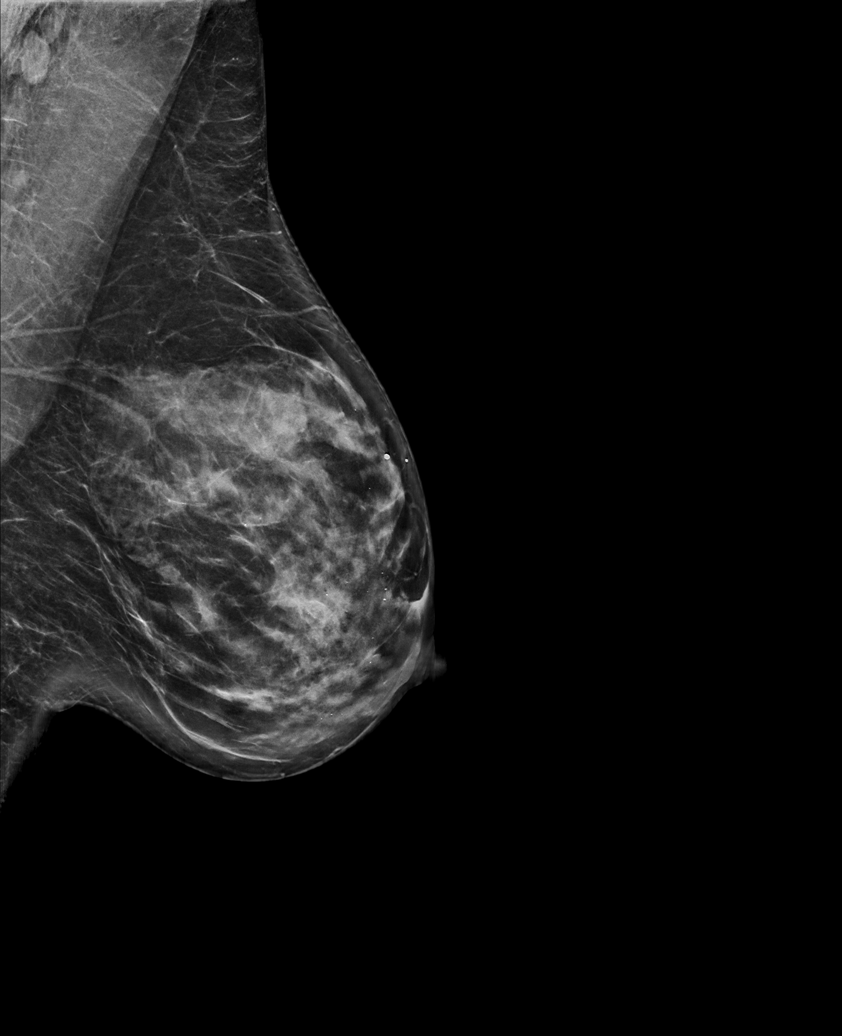

[R CC synth-2D (2 of 2)]
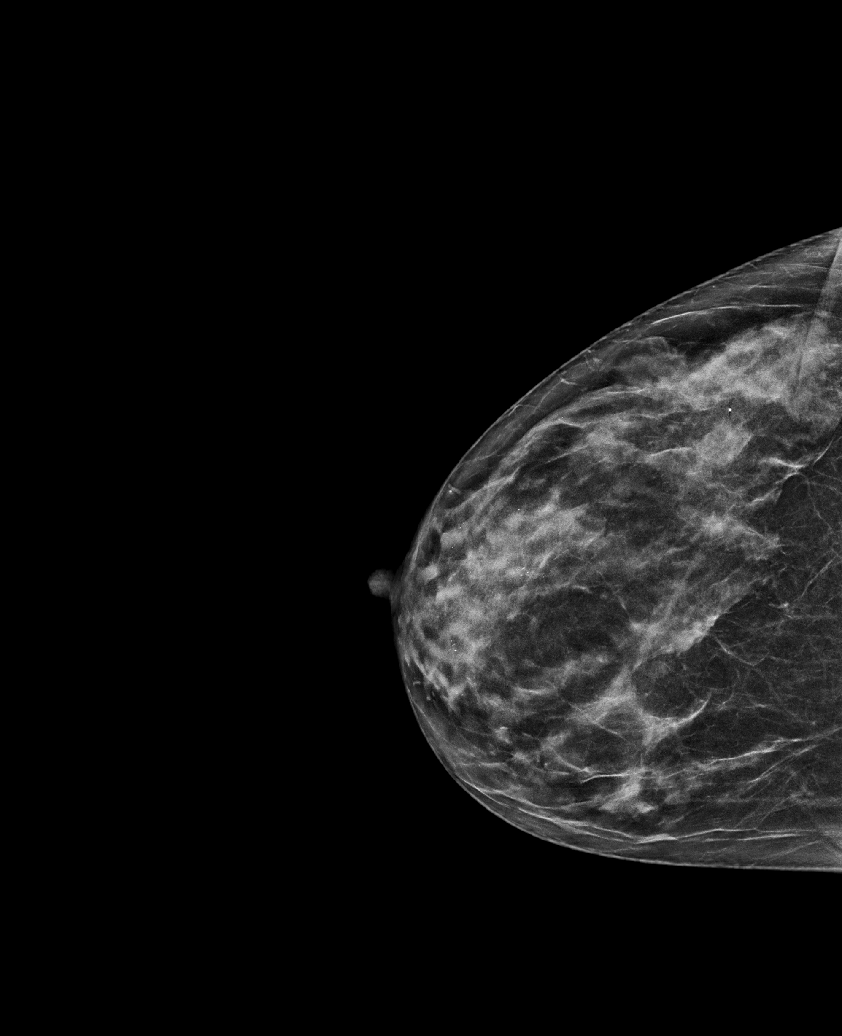

[L CC synth-2D]
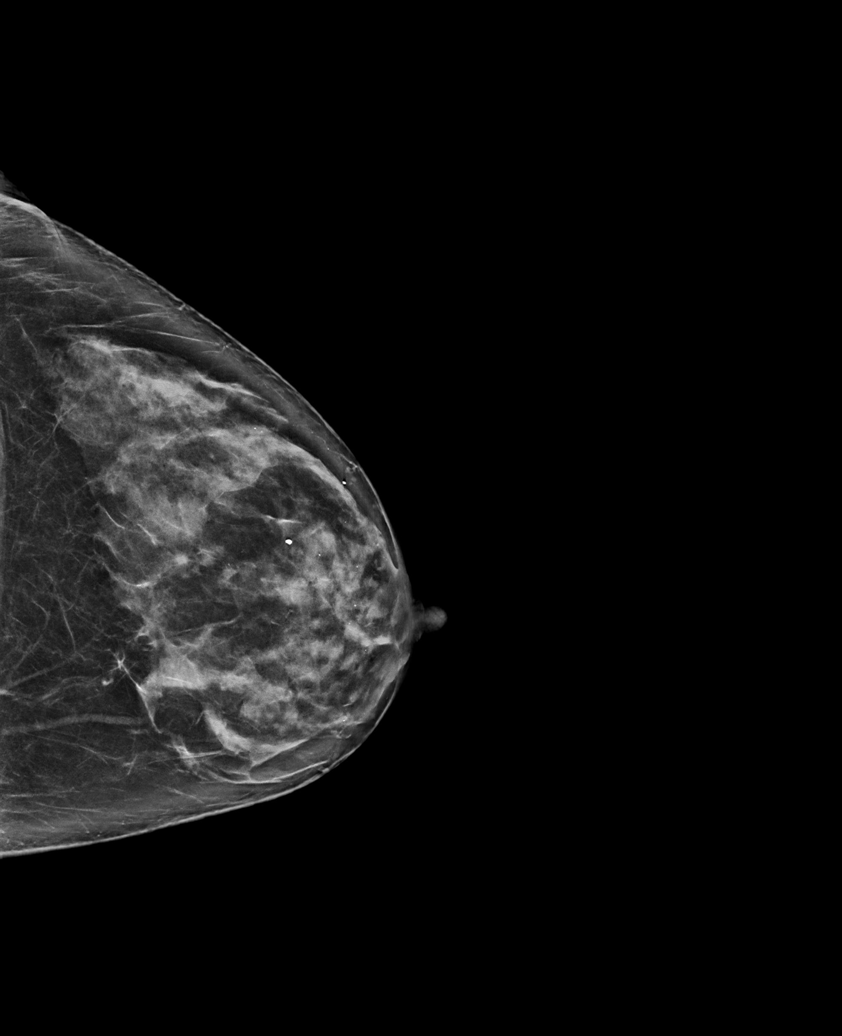

[R CC tomo · tomo slice 33/64.0]
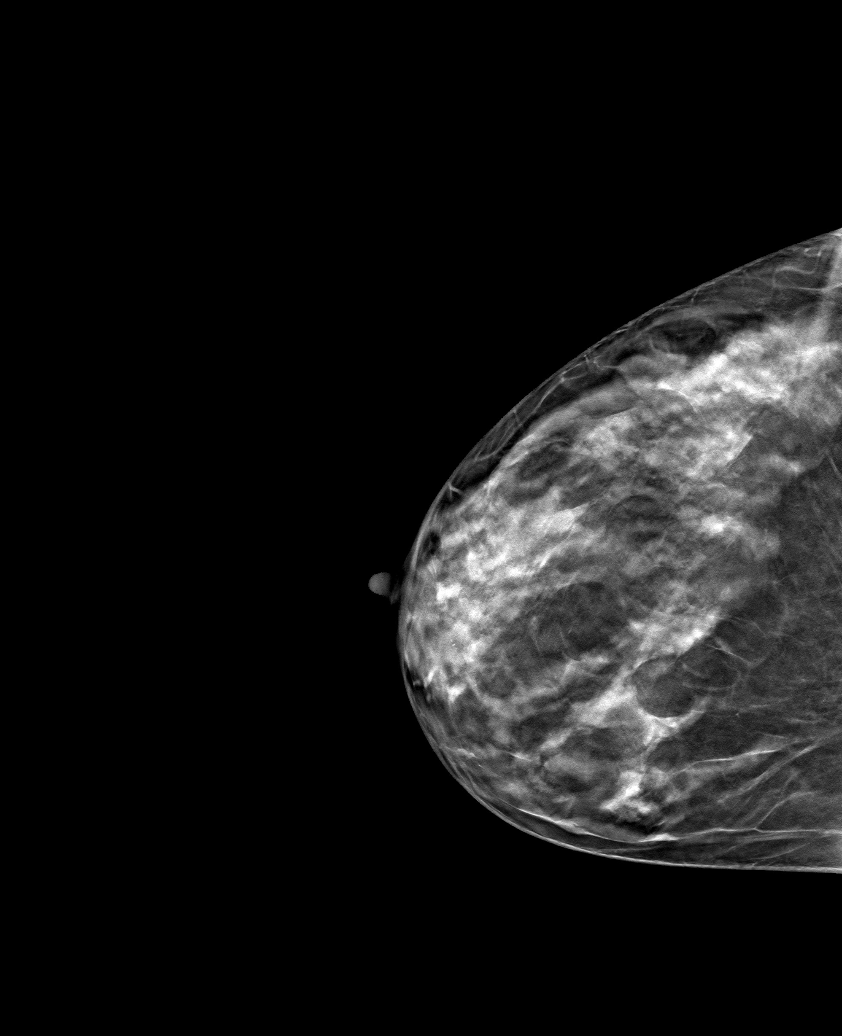

[6 of 30 positions shown; findings below may reference images not displayed]

ACR Breast Density Category c: The breast tissue is heterogeneously
dense, which may obscure small masses.
FINDINGS: There are no findings suspicious for malignancy.
IMPRESSION: No mammographic evidence of malignancy. A result letter of this
screening mammogram will be mailed directly to the patient.

RECOMMENDATION:
Screening mammogram in one year. (Code:Q3-W-BC3)

BI-RADS CATEGORY  1: Negative.

## 2022-06-29 ENCOUNTER — Other Ambulatory Visit: Payer: Self-pay | Admitting: Family Medicine

## 2022-06-29 DIAGNOSIS — Z1231 Encounter for screening mammogram for malignant neoplasm of breast: Secondary | ICD-10-CM

## 2022-07-01 ENCOUNTER — Other Ambulatory Visit: Payer: Self-pay | Admitting: Family Medicine

## 2022-07-03 NOTE — Telephone Encounter (Signed)
Last filled 05/18/22 Last ov 12/12/21

## 2022-07-30 ENCOUNTER — Telehealth: Payer: Self-pay | Admitting: Family Medicine

## 2022-07-30 DIAGNOSIS — E7800 Pure hypercholesterolemia, unspecified: Secondary | ICD-10-CM

## 2022-07-30 DIAGNOSIS — Z0000 Encounter for general adult medical examination without abnormal findings: Secondary | ICD-10-CM

## 2022-07-30 NOTE — Telephone Encounter (Signed)
-----   Message from Alvina Chou sent at 07/18/2022  4:12 PM EDT ----- Regarding: Lab orders for Monday 8.28.23 Patient is scheduled for CPX labs, please order future labs, Thanks , Camelia Eng

## 2022-07-31 ENCOUNTER — Other Ambulatory Visit (INDEPENDENT_AMBULATORY_CARE_PROVIDER_SITE_OTHER): Payer: BC Managed Care – PPO

## 2022-07-31 DIAGNOSIS — E7800 Pure hypercholesterolemia, unspecified: Secondary | ICD-10-CM

## 2022-07-31 DIAGNOSIS — Z0000 Encounter for general adult medical examination without abnormal findings: Secondary | ICD-10-CM

## 2022-07-31 LAB — COMPREHENSIVE METABOLIC PANEL
ALT: 13 U/L (ref 0–35)
AST: 16 U/L (ref 0–37)
Albumin: 4.3 g/dL (ref 3.5–5.2)
Alkaline Phosphatase: 46 U/L (ref 39–117)
BUN: 11 mg/dL (ref 6–23)
CO2: 25 mEq/L (ref 19–32)
Calcium: 9.1 mg/dL (ref 8.4–10.5)
Chloride: 102 mEq/L (ref 96–112)
Creatinine, Ser: 0.68 mg/dL (ref 0.40–1.20)
GFR: 95.4 mL/min (ref >60.00)
Glucose, Bld: 92 mg/dL (ref 70–99)
Potassium: 4.2 mEq/L (ref 3.5–5.1)
Sodium: 138 mEq/L (ref 135–145)
Total Bilirubin: 0.5 mg/dL (ref 0.2–1.2)
Total Protein: 7 g/dL (ref 6.0–8.3)

## 2022-07-31 LAB — TSH: TSH: 1.93 u[IU]/mL (ref 0.35–5.50)

## 2022-07-31 LAB — CBC WITH DIFFERENTIAL/PLATELET
Basophils Absolute: 0.1 10*3/uL (ref 0.0–0.1)
Basophils Relative: 1.1 % (ref 0.0–3.0)
Eosinophils Absolute: 0.2 10*3/uL (ref 0.0–0.7)
Eosinophils Relative: 2.5 % (ref 0.0–5.0)
HCT: 39.2 % (ref 36.0–46.0)
Hemoglobin: 13.3 g/dL (ref 12.0–15.0)
Lymphocytes Relative: 26.6 % (ref 12.0–46.0)
Lymphs Abs: 1.7 10*3/uL (ref 0.7–4.0)
MCHC: 34.1 g/dL (ref 30.0–36.0)
MCV: 92.1 fl (ref 78.0–100.0)
Monocytes Absolute: 0.5 10*3/uL (ref 0.1–1.0)
Monocytes Relative: 7.8 % (ref 3.0–12.0)
Neutro Abs: 4 10*3/uL (ref 1.4–7.7)
Neutrophils Relative %: 62 % (ref 43.0–77.0)
Platelets: 132 10*3/uL — ABNORMAL LOW (ref 150.0–400.0)
RBC: 4.25 Mil/uL (ref 3.87–5.11)
RDW: 13.1 % (ref 11.5–15.5)
WBC: 6.5 10*3/uL (ref 4.0–10.5)

## 2022-07-31 LAB — LIPID PANEL
Cholesterol: 188 mg/dL (ref 0–200)
HDL: 62.1 mg/dL (ref >39.00)
LDL Cholesterol: 100 mg/dL — ABNORMAL HIGH (ref 0–99)
NonHDL: 125.85
Total CHOL/HDL Ratio: 3
Triglycerides: 130 mg/dL (ref 0.0–149.0)
VLDL: 26 mg/dL (ref 0.0–40.0)

## 2022-08-01 ENCOUNTER — Ambulatory Visit
Admission: RE | Admit: 2022-08-01 | Discharge: 2022-08-01 | Disposition: A | Discharge status: Home / Self Care | Payer: BC Managed Care – PPO | Source: Ambulatory Visit | Attending: Family Medicine | Admitting: Family Medicine

## 2022-08-01 DIAGNOSIS — Z1231 Encounter for screening mammogram for malignant neoplasm of breast: Secondary | ICD-10-CM | POA: Insufficient documentation

## 2022-08-04 ENCOUNTER — Ambulatory Visit (INDEPENDENT_AMBULATORY_CARE_PROVIDER_SITE_OTHER): Payer: BC Managed Care – PPO | Admitting: Family Medicine

## 2022-08-04 ENCOUNTER — Encounter: Payer: Self-pay | Admitting: Family Medicine

## 2022-08-04 VITALS — BP 124/70 | HR 67 | Temp 97.6°F | Ht 64.0 in | Wt 150.4 lb

## 2022-08-04 DIAGNOSIS — F411 Generalized anxiety disorder: Secondary | ICD-10-CM

## 2022-08-04 DIAGNOSIS — Z0000 Encounter for general adult medical examination without abnormal findings: Secondary | ICD-10-CM | POA: Diagnosis not present

## 2022-08-04 DIAGNOSIS — Z23 Encounter for immunization: Secondary | ICD-10-CM | POA: Diagnosis not present

## 2022-08-04 DIAGNOSIS — E7800 Pure hypercholesterolemia, unspecified: Secondary | ICD-10-CM

## 2022-08-04 MED ORDER — VENLAFAXINE HCL ER 37.5 MG PO CP24: 37.5000 mg | ORAL_CAPSULE | Freq: Every day | ORAL | 3 refills | Status: DC

## 2022-08-04 NOTE — Assessment & Plan Note (Signed)
Reviewed health habits including diet and exercise and skin cancer prevention Reviewed appropriate screening tests for age  Also reviewed health mt list, fam hx and immunization status , as well as social and family history   See HPI Labs reviewed  Plans flu shot in the fall  Mammogram utd 07/2022 Gyn visit utd /tapering HRT Colonoscopy due feb 2024 Enc her to get started with exercise /yoga

## 2022-08-04 NOTE — Assessment & Plan Note (Signed)
Disc goals for lipids and reasons to control them Rev last labs with pt Rev low sat fat diet in detail Fair control with crestor 5 mg   LDL is 100 and HDL in 60s Was higher prior-plans to inc exercise

## 2022-08-04 NOTE — Patient Instructions (Addendum)
You can try tapering the effexor to 37.5 mg daily  Let us know if you have any problems There will be adjustment   Get a flu shot in the fall   You are due for a colonoscopy in February   Try to get 1200-1500 mg of calcium per day with at least 1000 iu of vitamin D - for bone health  Pneumonia vaccine today   Start regular exercise  Good for brain and body   Keep taking care of yourself

## 2022-08-04 NOTE — Assessment & Plan Note (Signed)
Pt is doing better overall Wants to wean the effexor xr   Will cut dose from 75 to 37.5 mg daily  Antic guidance re: symptoms to watch for  If doing well will make plan to wean off  Will watch for anxiety and depression symptoms

## 2022-08-04 NOTE — Progress Notes (Signed)
Subjective:    Patient ID: Beth Nielsen, female    DOB: [birth year], 61 y.o.   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 Is walking  Interested in a lot  Gardening   Feeling good overall   Immunization History  Administered Date(s) Administered   Influenza,inj,Quad PF,6+ Mos 08/01/2019, 08/24/2020   PFIZER(Purple Top)SARS-COV-2 Vaccination 02/01/2020, 02/23/2020, 09/14/2020   Pfizer Covid-19 Vaccine Bivalent Booster 53yrs & up 10/02/2021   Td 02/19/2018   Zoster Recombinat (Shingrix) 08/24/2020, 03/03/2021   Zoster, Live 04/09/2015   Health Maintenance Due  Topic Date Due   INFLUENZA VACCINE  07/04/2022   Mammogram utd 07/2022 Self breast exam: no lumps   Sees gyn  Had visit 6/13 On HRT-tapered dose this time  H/o hysterectomy total    Colonoscopy 01/2013 with 10 y recall No GI change   Gets flu shot in the fall   Supplements-none Exercise - good   No fam h/o OP No falls or fx   Had pna in January and once before  Has not had pna vaccine    BP Readings from Last 3 Encounters:  08/04/22 124/70  12/12/21 114/68  07/18/21 120/72   Pulse Readings from Last 3 Encounters:  08/04/22 67  12/12/21 77  12/07/21 81     Mood/anxiety disorder Effexor xr 75 mg  Pt is interested in tapering dose Is through the worst of her stress       Hyperlipidemia Lab Results  Component Value Date   CHOL 188 07/31/2022   CHOL 189 07/12/2021   CHOL 164 10/14/2020   Lab Results  Component Value Date   HDL 62.10 07/31/2022   HDL 70.20 07/12/2021   HDL 71.50 10/14/2020   Lab Results  Component Value Date   LDLCALC 100 (H) 07/31/2022   LDLCALC 102 (H)  07/12/2021   LDLCALC 77 10/14/2020   Lab Results  Component Value Date   TRIG 130.0 07/31/2022   TRIG 84.0 07/12/2021   TRIG 76.0 10/14/2020   Lab Results  Component Value Date   CHOLHDL 3 07/31/2022   CHOLHDL 3 07/12/2021   CHOLHDL 2 10/14/2020   No results found for: "LDLDIRECT"   Crestor 5 mg daily  Diet - is good   Lab Results  Component Value Date   CREATININE 0.68 07/31/2022   BUN 11 07/31/2022   NA 138 07/31/2022   K 4.2 07/31/2022   CL 102 07/31/2022   CO2 25 07/31/2022   Lab Results  Component Value Date   ALT 13 07/31/2022   AST 16 07/31/2022   ALKPHOS 46 07/31/2022   BILITOT 0.5 07/31/2022   Lab Results  Component Value Date   WBC 6.5 07/31/2022   HGB 13.3 07/31/2022   HCT 39.2 07/31/2022   MCV 92.1 07/31/2022   PLT (L) 07/31/2022    132.0 Result may be falsely decreased due to platelet clumping.   Lab Results  Component Value Date   TSH 1.93 07/31/2022    Patient Active Problem List   Diagnosis Date Noted   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/18/2021   Callus of foot 07/18/2021  Allergic rhinitis 04/06/2021   Hyperlipidemia 07/12/2020   Routine general medical examination at a health care facility 07/04/2020   Anxiety disorder 06/30/2019   Hearing decreased 06/30/2019   Past Medical History:  Diagnosis Date   History of chickenpox    History of UTI    Past Surgical History:  Procedure Laterality Date   BLADDER SURGERY  2018   removal of benign growth   ECTOPIC PREGNANCY SURGERY  2003   HYSTEROTOMY  2018   TONSILLECTOMY  1972   WISDOM TOOTH EXTRACTION     age 70   Social History   Tobacco Use   Smoking status: Never   Smokeless tobacco: Never  Substance Use Topics   Alcohol use: Yes    Comment: glass of wine a day   Drug use: Never   Family History  Problem Relation Age of Onset   Alzheimer's disease Mother    Glaucoma Mother    Blindness Mother    Heart failure Father    Hyperlipidemia Father    COPD  Brother    Depression Brother    Hyperlipidemia Brother    Arthritis Paternal Grandmother    Breast cancer Neg Hx    No Known Allergies Current Outpatient Medications on File Prior to Visit  Medication Sig Dispense Refill   estradiol (CLIMARA - DOSED IN MG/24 HR) 0.075 mg/24hr patch Place 0.075 mg onto the skin once a week.     rosuvastatin (CRESTOR) 5 MG tablet TAKE 1 TABLET (5 MG TOTAL) BY MOUTH DAILY. 90 tablet 3   No current facility-administered medications on file prior to visit.      Review of Systems  Constitutional:  Negative for activity change, appetite change, fatigue, fever and unexpected weight change.  HENT:  Negative for congestion, ear pain, rhinorrhea, sinus pressure and sore throat.   Eyes:  Negative for pain, redness and visual disturbance.  Respiratory:  Negative for cough, shortness of breath and wheezing.   Cardiovascular:  Negative for chest pain and palpitations.  Gastrointestinal:  Negative for abdominal pain, blood in stool, constipation and diarrhea.  Endocrine: Negative for polydipsia and polyuria.  Genitourinary:  Negative for dysuria, frequency and urgency.  Musculoskeletal:  Negative for arthralgias, back pain and myalgias.  Skin:  Negative for pallor and rash.  Allergic/Immunologic: Negative for environmental allergies.  Neurological:  Negative for dizziness, syncope and headaches.  Hematological:  Negative for adenopathy. Does not bruise/bleed easily.  Psychiatric/Behavioral:  Negative for decreased concentration and dysphoric mood. The patient is not nervous/anxious.        Objective:   Physical Exam Constitutional:      General: She is not in acute distress.    Appearance: Normal appearance. She is well-developed. She is not ill-appearing or diaphoretic.  HENT:     Head: Normocephalic and atraumatic.     Right Ear: Tympanic membrane, ear canal and external ear normal.     Left Ear: Tympanic membrane, ear canal and external ear normal.      Nose: Nose normal. No congestion.     Mouth/Throat:     Mouth: Mucous membranes are moist.     Pharynx: Oropharynx is clear. No posterior oropharyngeal erythema.  Eyes:     General: No scleral icterus.    Extraocular Movements: Extraocular movements intact.     Conjunctiva/sclera: Conjunctivae normal.     Pupils: Pupils are equal, round, and reactive to light.  Neck:     Thyroid: No thyromegaly.     Vascular: No carotid  bruit or JVD.  Cardiovascular:     Rate and Rhythm: Normal rate and regular rhythm.     Pulses: Normal pulses.     Heart sounds: Normal heart sounds.     No gallop.  Pulmonary:     Effort: Pulmonary effort is normal. No respiratory distress.     Breath sounds: Normal breath sounds. No wheezing.     Comments: Good air exch Chest:     Chest wall: No tenderness.  Abdominal:     General: Bowel sounds are normal. There is no distension or abdominal bruit.     Palpations: Abdomen is soft. There is no mass.     Tenderness: There is no abdominal tenderness.     Hernia: No hernia is present.  Genitourinary:    Comments: Breast and pelvic exam done by gynecologist Musculoskeletal:        General: No tenderness. Normal range of motion.     Cervical back: Normal range of motion and neck supple. No rigidity. No muscular tenderness.     Right lower leg: No edema.     Left lower leg: No edema.     Comments: No kyphosis   Lymphadenopathy:     Cervical: No cervical adenopathy.  Skin:    General: Skin is warm and dry.     Coloration: Skin is not pale.     Findings: No erythema or rash.     Comments: Solar lentigines diffusely   Neurological:     Mental Status: She is alert. Mental status is at baseline.     Cranial Nerves: No cranial nerve deficit.     Motor: No abnormal muscle tone.     Coordination: Coordination normal.     Gait: Gait normal.     Deep Tendon Reflexes: Reflexes are normal and symmetric. Reflexes normal.  Psychiatric:        Mood and Affect: Mood  normal.        Cognition and Memory: Cognition and memory normal.           Assessment & Plan:   Problem List Items Addressed This Visit       Other   Anxiety disorder    Pt is doing better overall Wants to wean the effexor xr   Will cut dose from 75 to 37.5 mg daily  Antic guidance re: symptoms to watch for  If doing well will make plan to wean off  Will watch for anxiety and depression symptoms      Relevant Medications   venlafaxine XR (EFFEXOR XR) 37.5 MG 24 hr capsule   Hyperlipidemia    Disc goals for lipids and reasons to control them Rev last labs with pt Rev low sat fat diet in detail Fair control with crestor 5 mg   LDL is 100 and HDL in 60s Was higher prior-plans to inc exercise       Routine general medical examination at a health care facility - Primary    Reviewed health habits including diet and exercise and skin cancer prevention Reviewed appropriate screening tests for age  Also reviewed health mt list, fam hx and immunization status , as well as social and family history   See HPI Labs reviewed  Plans flu shot in the fall  Mammogram utd 07/2022 Gyn visit utd /tapering HRT Colonoscopy due feb 2024 Enc her to get started with exercise /yoga        Relevant Orders   Pneumococcal conjugate vaccine 20-valent (Prevnar 20) (Completed)  Other Visit Diagnoses     Need for vaccination with 20-polyvalent pneumococcal conjugate vaccine       Relevant Orders   Pneumococcal conjugate vaccine 20-valent (Prevnar 20) (Completed)

## 2022-09-26 ENCOUNTER — Ambulatory Visit: Payer: BC Managed Care – PPO | Admitting: Internal Medicine

## 2022-09-26 ENCOUNTER — Encounter: Payer: Self-pay | Admitting: Internal Medicine

## 2022-09-26 DIAGNOSIS — J0140 Acute pansinusitis, unspecified: Secondary | ICD-10-CM | POA: Insufficient documentation

## 2022-09-26 MED ORDER — AMOXICILLIN-POT CLAVULANATE 875-125 MG PO TABS: 1.0000 | ORAL_TABLET | Freq: Two times a day (BID) | ORAL | 0 refills | Status: DC

## 2022-09-26 NOTE — Assessment & Plan Note (Signed)
Seems to complicate viral infection that started about a week ago Also has right ear findings Discussed supportive care---especially analgesics augmentin 875 bid x 7d

## 2022-09-26 NOTE — Progress Notes (Signed)
Subjective:    Patient ID: Beth Nielsen, female    DOB: 1961-08-29, 61 y.o.   MRN: [id number]  HPI Here due to respiratory infection  Has been sick for a week--and worsening Started after vaccines for flu and COVID--- 10/16 2 days later---felt "odd"---tested for COVID and negative Itchy throat, slight nasal congestion, cough Now more in head with worsened nasal congestion and cough Coughed something up this morning Fever yesterday and some aching Still able to get out to walk the dog  Some headache--frontal No ear pain---or just slight Pain behind eyes  Did try sudafed and throat lozenges and mucinex May have helped some Repeat COVID test negative today Slight "breathy" feeling--not overt SOB  Current Outpatient Medications on File Prior to Visit  Medication Sig Dispense Refill   estradiol (CLIMARA - DOSED IN MG/24 HR) 0.075 mg/24hr patch Place 0.075 mg onto the skin once a week.     rosuvastatin (CRESTOR) 5 MG tablet TAKE 1 TABLET (5 MG TOTAL) BY MOUTH DAILY. 90 tablet 3   venlafaxine XR (EFFEXOR XR) 37.5 MG 24 hr capsule Take 1 capsule (37.5 mg total) by mouth daily with breakfast. 90 capsule 3   No current facility-administered medications on file prior to visit.    No Known Allergies  Past Medical History:  Diagnosis Date   History of chickenpox    History of UTI     Past Surgical History:  Procedure Laterality Date   BLADDER SURGERY  2018   removal of benign growth   ECTOPIC PREGNANCY SURGERY  2003   HYSTEROTOMY  2018   TONSILLECTOMY  1972   WISDOM TOOTH EXTRACTION     age 85    Family History  Problem Relation Age of Onset   Alzheimer's disease Mother    Glaucoma Mother    Blindness Mother    Heart failure Father    Hyperlipidemia Father    COPD Brother    Depression Brother    Hyperlipidemia Brother    Arthritis Paternal Grandmother    Breast cancer Neg Hx     Social History   Socioeconomic History   Marital status: Married    Spouse  name: Not on file   Number of children: Not on file   Years of education: Not on file   Highest education level: Not on file  Occupational History   Not on file  Tobacco Use   Smoking status: Never   Smokeless tobacco: Never  Substance and Sexual Activity   Alcohol use: Yes    Comment: glass of wine a day   Drug use: Never   Sexual activity: Not on file  Other Topics Concern   Not on file  Social History Narrative   Not on file   Social Determinants of Health   Financial Resource Strain: Not on file  Food Insecurity: Not on file  Transportation Needs: Not on file  Physical Activity: Not on file  Stress: Not on file  Social Connections: Not on file  Intimate Partner Violence: Not on file   Review of Systems Not sleeping well---due to cough No N/V Eating fair--appetite is off     Objective:   Physical Exam Constitutional:      Appearance: Normal appearance.  HENT:     Head:     Comments: Frontal > maxillary tenderness    Left Ear: Tympanic membrane and ear canal normal.     Ears:     Comments: Right TM with superior bulging and redness  Mouth/Throat:     Pharynx: No oropharyngeal exudate or posterior oropharyngeal erythema.  Pulmonary:     Effort: Pulmonary effort is normal.     Breath sounds: Normal breath sounds. No wheezing or rales.  Musculoskeletal:     Cervical back: Neck supple.  Lymphadenopathy:     Cervical: No cervical adenopathy.  Neurological:     Mental Status: She is alert.            Assessment & Plan:

## 2022-10-02 ENCOUNTER — Ambulatory Visit: Payer: BC Managed Care – PPO | Admitting: Family Medicine

## 2022-12-26 ENCOUNTER — Other Ambulatory Visit: Payer: Self-pay

## 2022-12-26 ENCOUNTER — Emergency Department
Admission: EM | Admit: 2022-12-26 | Discharge: 2022-12-27 | Disposition: A | Discharge status: Home / Self Care | Payer: BC Managed Care – PPO | Attending: Emergency Medicine | Admitting: Emergency Medicine

## 2022-12-26 ENCOUNTER — Telehealth (INDEPENDENT_AMBULATORY_CARE_PROVIDER_SITE_OTHER): Payer: BC Managed Care – PPO | Admitting: Family Medicine

## 2022-12-26 ENCOUNTER — Encounter: Payer: Self-pay | Admitting: Family Medicine

## 2022-12-26 VITALS — Ht 64.0 in | Wt 151.0 lb

## 2022-12-26 DIAGNOSIS — K226 Gastro-esophageal laceration-hemorrhage syndrome: Secondary | ICD-10-CM | POA: Insufficient documentation

## 2022-12-26 DIAGNOSIS — R112 Nausea with vomiting, unspecified: Secondary | ICD-10-CM | POA: Insufficient documentation

## 2022-12-26 DIAGNOSIS — K529 Noninfective gastroenteritis and colitis, unspecified: Secondary | ICD-10-CM | POA: Diagnosis not present

## 2022-12-26 DIAGNOSIS — I10 Essential (primary) hypertension: Secondary | ICD-10-CM | POA: Diagnosis not present

## 2022-12-26 DIAGNOSIS — R431 Parosmia: Secondary | ICD-10-CM | POA: Diagnosis not present

## 2022-12-26 DIAGNOSIS — R197 Diarrhea, unspecified: Secondary | ICD-10-CM | POA: Diagnosis not present

## 2022-12-26 LAB — URINALYSIS, ROUTINE W REFLEX MICROSCOPIC
Bilirubin Urine: NEGATIVE
Glucose, UA: NEGATIVE mg/dL
Ketones, ur: 20 mg/dL — AB
Leukocytes,Ua: NEGATIVE
Nitrite: NEGATIVE
Protein, ur: 30 mg/dL — AB
Specific Gravity, Urine: 1.025 (ref 1.005–1.030)
pH: 5 (ref 5.0–8.0)

## 2022-12-26 LAB — CBC
HCT: 39.3 % (ref 36.0–46.0)
Hemoglobin: 13.4 g/dL (ref 12.0–15.0)
MCH: 30.8 pg (ref 26.0–34.0)
MCHC: 34.1 g/dL (ref 30.0–36.0)
MCV: 90.3 fL (ref 80.0–100.0)
Platelets: 255 10*3/uL (ref 150–400)
RBC: 4.35 MIL/uL (ref 3.87–5.11)
RDW: 12.8 % (ref 11.5–15.5)
WBC: 8 10*3/uL (ref 4.0–10.5)
nRBC: 0 % (ref 0.0–0.2)

## 2022-12-26 LAB — COMPREHENSIVE METABOLIC PANEL
ALT: 18 U/L (ref 0–44)
AST: 24 U/L (ref 15–41)
Albumin: 4.1 g/dL (ref 3.5–5.0)
Alkaline Phosphatase: 44 U/L (ref 38–126)
Anion gap: 7 (ref 5–15)
BUN: 13 mg/dL (ref 6–20)
CO2: 24 mmol/L (ref 22–32)
Calcium: 8.5 mg/dL — ABNORMAL LOW (ref 8.9–10.3)
Chloride: 104 mmol/L (ref 98–111)
Creatinine, Ser: 0.62 mg/dL (ref 0.44–1.00)
GFR, Estimated: >60 mL/min (ref >60)
Glucose, Bld: 148 mg/dL — ABNORMAL HIGH (ref 70–99)
Potassium: 3.5 mmol/L (ref 3.5–5.1)
Sodium: 135 mmol/L (ref 135–145)
Total Bilirubin: 1.4 mg/dL — ABNORMAL HIGH (ref 0.3–1.2)
Total Protein: 7.4 g/dL (ref 6.5–8.1)

## 2022-12-26 LAB — LIPASE, BLOOD: Lipase: 39 U/L (ref 11–51)

## 2022-12-26 MED ORDER — SODIUM CHLORIDE 0.9 % IV BOLUS
1000.0000 mL | Freq: Once | INTRAVENOUS | Status: AC
  Administered 2022-12-27: 1000 mL via INTRAVENOUS

## 2022-12-26 MED ORDER — PANTOPRAZOLE SODIUM 40 MG IV SOLR
40.0000 mg | Freq: Once | INTRAVENOUS | Status: AC
  Administered 2022-12-27: 40 mg via INTRAVENOUS
  Filled 2022-12-26: qty 10

## 2022-12-26 MED ORDER — ONDANSETRON 4 MG PO TBDP
4.0000 mg | ORAL_TABLET | Freq: Once | ORAL | Status: AC | PRN
  Administered 2022-12-26: 4 mg via ORAL
  Filled 2022-12-26: qty 1

## 2022-12-26 MED ORDER — ONDANSETRON HCL 4 MG/2ML IJ SOLN
4.0000 mg | Freq: Once | INTRAMUSCULAR | Status: AC
  Administered 2022-12-27: 4 mg via INTRAVENOUS
  Filled 2022-12-26: qty 2

## 2022-12-26 NOTE — ED Provider Notes (Signed)
[facility] Provider Note    Event Date/Time   First MD Initiated Contact with Patient 12/26/22 2335     (approximate)   History   Diarrhea and Emesis   HPI {Remember to add pertinent medical, surgical, social, and/or OB history to HPI:1} Beth Nielsen is a 62 y.o. female  ***with a history of hyperlipidemia and anxiety who presents with  Nausea, diarrhea this AM, then several episodes of vomiting, then one that was red, then more brown.  Also with dark urine but no dysuria.   I reviewed the past medical records.  The patient was seen in a telehealth visit by Dr. Volanda Napoleon from primary care today although this note is not yet available.  Her most recent outpatient encounter was with Dr. Silvio Pate on 09/26/2022 for cough and URI symptoms.  She has no recent ED visits or admissions.   Physical Exam   Triage Vital Signs: ED Triage Vitals  Enc Vitals Group     BP 12/26/22 2007 116/68     Pulse Rate 12/26/22 2007 79     Resp 12/26/22 2007 18     Temp 12/26/22 2007 98.7 F (37.1 C)     Temp Source 12/26/22 2007 Oral     SpO2 12/26/22 2007 95 %     Weight 12/26/22 2021 148 lb (67.1 kg)     Height 12/26/22 2021 5\' 4"  (1.626 m)     Head Circumference --      Peak Flow --      Pain Score 12/26/22 2021 3     Pain Loc --      Pain Edu? --      Excl. in [place]? --     Most recent vital signs: Vitals:   12/26/22 2007  BP: 116/68  Pulse: 79  Resp: 18  Temp: 98.7 F (37.1 C)  SpO2: 95%    {Only need to document appropriate and relevant physical exam:1} General: Awake, no distress. *** CV:  Good peripheral perfusion. *** Resp:  Normal effort. *** Abd:  No distention. *** Other:  ***   ED Results / Procedures / Treatments   Labs (all labs ordered are listed, but only abnormal results are displayed) Labs Reviewed  COMPREHENSIVE METABOLIC PANEL - Abnormal; Notable for the following components:      Result Value   Glucose, Bld 148 (*)    Calcium 8.5  (*)    Total Bilirubin 1.4 (*)    All other components within normal limits  URINALYSIS, ROUTINE W REFLEX MICROSCOPIC - Abnormal; Notable for the following components:   Color, Urine YELLOW (*)    APPearance HAZY (*)    Hgb urine dipstick MODERATE (*)    Ketones, ur 20 (*)    Protein, ur 30 (*)    Bacteria, UA RARE (*)    All other components within normal limits  LIPASE, BLOOD  CBC     EKG  ***   RADIOLOGY *** {USE THE WORD "INTERPRETED"!! You MUST document your own interpretation of imaging, as well as the fact that you reviewed the radiologist's report!:1}   PROCEDURES:  Critical Care performed: {CriticalCareYesNo:19197::"Yes, see critical care procedure note(s)","No"}  Procedures   MEDICATIONS ORDERED IN ED: Medications  ondansetron (ZOFRAN-ODT) disintegrating tablet 4 mg (4 mg Oral Given 12/26/22 2025)     IMPRESSION / MDM / [place] / ED COURSE  I reviewed the triage vital signs and the nursing notes.  Differential diagnosis includes, but is not limited to, ***  Patient's presentation is most consistent with {EM COPA:27473}  *** {If the patient is on the monitor, remove the brackets and asterisks on the sentence below and remember to document it as a Procedure as well. Otherwise delete the sentence below:1} {**The patient is on the cardiac monitor to evaluate for evidence of arrhythmia and/or significant heart rate changes.**} {Remember to include, when applicable, any/all of the following data: independent review of imaging independent review of labs (comment specifically on pertinent positives and negatives) review of specific prior hospitalizations, PCP/specialist notes, etc. discuss meds given and prescribed document any discussion with consultants (including hospitalists) any clinical decision tools you used and why (PECARN, NEXUS, etc.) did you consider admitting the patient? document social determinants of  health affecting patient's care (homelessness, inability to follow up in a timely fashion, etc) document any pre-existing conditions increasing risk on current visit (e.g. diabetes and HTN increasing danger of high-risk chest pain/ACS) describes what meds you gave (especially parenteral) and why any other interventions?:1}     FINAL CLINICAL IMPRESSION(S) / ED DIAGNOSES   Final diagnoses:  None     Rx / DC Orders   ED Discharge Orders     None        Note:  This document was prepared using Dragon voice recognition software and may include unintentional dictation errors.

## 2022-12-26 NOTE — ED Triage Notes (Signed)
Pt comes from home via [facility] c/o diarrhea and vomiting. Pt states yesterday she ate salad and after that started to feel nauseous. Today she had one episode of explosive diarrhea and about 10 episodes of vomiting. Pt reports no appetite and hasn't really eaten today. Pt in NAD, denies CP, SOB

## 2022-12-26 NOTE — Progress Notes (Unsigned)
Virtual Visit via Video note  I connected with Beth Nielsen on 12/27/22 at 1600 by video and verified that I am speaking with the correct person using two identifiers. Beth Nielsen is currently located at home and  is currently alone during visit. The provider, Carollee Leitz, MD is located in their office at time of visit.  I discussed the limitations, risks, security and privacy concerns of performing an evaluation and management service by video and the availability of in person appointments. I also discussed with the patient that there may be a patient responsible charge related to this service. The patient expressed understanding and agreed to proceed.  Subjective: PCP: Abner Greenspan, MD  Chief Complaint  Patient presents with   Acute Visit    HPI Patient concern for lingering scent of air freshener.  Reports visiting friends in [place], [place] and air freshener was extremely strong in her room.  Took the air freshener out of plugging in an it spilled on counter.  Reports having washed and cleaned area but continued to have strong scent in room.  She is wondering if she may have some type of poisoning from this.  Did not ingest oil or have copious amounts on skin.  Denies any headaches, chest pain or shortness of breath.  She reports having eaten food that she thinks may have been "too long" and a few glasses of wine.  Had 1 episode of nonbloody explosive diarrhea on airplane and vomited nonbloody nonbilious emesis on flight home.  Has had no further episodes of vomiting or diarrhea.  Tolerating ginger ale and saltine crackers.  Denies any fevers, abdominal pain or bloody stool.  No urinary symptoms.   ROS: Per HPI  Current Outpatient Medications:    amoxicillin-clavulanate (AUGMENTIN) 875-125 MG tablet, Take 1 tablet by mouth 2 (two) times daily., Disp: 14 tablet, Rfl: 0   estradiol (CLIMARA - DOSED IN MG/24 HR) 0.075 mg/24hr patch, Place 0.075 mg onto the skin once a week., Disp: ,  Rfl:    rosuvastatin (CRESTOR) 5 MG tablet, TAKE 1 TABLET (5 MG TOTAL) BY MOUTH DAILY., Disp: 90 tablet, Rfl: 3   venlafaxine XR (EFFEXOR XR) 37.5 MG 24 hr capsule, Take 1 capsule (37.5 mg total) by mouth daily with breakfast., Disp: 90 capsule, Rfl: 3   ondansetron (ZOFRAN-ODT) 8 MG disintegrating tablet, Take 1 tablet (8 mg total) by mouth every 8 (eight) hours as needed for nausea or vomiting., Disp: 15 tablet, Rfl: 0   pantoprazole (PROTONIX) 40 MG tablet, Take 1 tablet (40 mg total) by mouth daily., Disp: 30 tablet, Rfl: 0  Observations/Objective: Physical Exam Constitutional:      General: She is not in acute distress.    Appearance: She is not ill-appearing or toxic-appearing.  Pulmonary:     Effort: Pulmonary effort is normal.  Neurological:     Mental Status: She is alert and oriented to person, place, and time. Mental status is at baseline.  Psychiatric:        Mood and Affect: Mood normal.        Behavior: Behavior normal.        Thought Content: Thought content normal.        Judgment: Judgment normal.    Assessment and Plan: Increased sense of smell Assessment & Plan: Triggered by air freshener and patient supersensitive to scents. Can use saline spray or Nettie pot to flush nasal area Strong scent may also trigger nausea. If symptoms worsen can follow-up with PCP.  Gastroenteritis Assessment & Plan: 1 episode of nonbloody explosive diarrhea and 3-4 episodes nonbilious nonbloody emesis that has since resolved.  Continues to remain nauseous intermittently.  Able to tolerate p.o.  Consider viral etiology, possible food sensitivity. Maintain hydration If nausea and vomiting worsens can send in prescription for Zofran. Follow-up with MD if no improvement Strict return precautions provided     Follow Up Instructions: Return if symptoms worsen or fail to improve.   I discussed the assessment and treatment plan with the patient. The patient was provided an  opportunity to ask questions and all were answered. The patient agreed with the plan and demonstrated an understanding of the instructions.   The patient was advised to call back or seek an in-person evaluation if the symptoms worsen or if the condition fails to improve as anticipated.  The above assessment and management plan was discussed with the patient. The patient verbalized understanding of and has agreed to the management plan. Patient is aware to call the clinic if symptoms persist or worsen. Patient is aware when to return to the clinic for a follow-up visit. Patient educated on when it is appropriate to go to the emergency department.   Carollee Leitz, MD

## 2022-12-27 ENCOUNTER — Telehealth: Payer: Self-pay

## 2022-12-27 ENCOUNTER — Encounter: Payer: Self-pay | Admitting: Family Medicine

## 2022-12-27 DIAGNOSIS — K529 Noninfective gastroenteritis and colitis, unspecified: Secondary | ICD-10-CM | POA: Insufficient documentation

## 2022-12-27 DIAGNOSIS — R431 Parosmia: Secondary | ICD-10-CM | POA: Insufficient documentation

## 2022-12-27 LAB — CBC WITH DIFFERENTIAL/PLATELET
Abs Immature Granulocytes: 0.02 10*3/uL (ref 0.00–0.07)
Basophils Absolute: 0 10*3/uL (ref 0.0–0.1)
Basophils Relative: 0 %
Eosinophils Absolute: 0 10*3/uL (ref 0.0–0.5)
Eosinophils Relative: 0 %
HCT: 41.1 % (ref 36.0–46.0)
Hemoglobin: 13.7 g/dL (ref 12.0–15.0)
Immature Granulocytes: 0 %
Lymphocytes Relative: 13 %
Lymphs Abs: 1 10*3/uL (ref 0.7–4.0)
MCH: 30.4 pg (ref 26.0–34.0)
MCHC: 33.3 g/dL (ref 30.0–36.0)
MCV: 91.1 fL (ref 80.0–100.0)
Monocytes Absolute: 0.3 10*3/uL (ref 0.1–1.0)
Monocytes Relative: 4 %
Neutro Abs: 6.3 10*3/uL (ref 1.7–7.7)
Neutrophils Relative %: 83 %
Platelets: 142 10*3/uL — ABNORMAL LOW (ref 150–400)
RBC: 4.51 MIL/uL (ref 3.87–5.11)
RDW: 12.9 % (ref 11.5–15.5)
WBC: 7.7 10*3/uL (ref 4.0–10.5)
nRBC: 0 % (ref 0.0–0.2)

## 2022-12-27 MED ORDER — ONDANSETRON 8 MG PO TBDP: 8.0000 mg | ORAL_TABLET | Freq: Three times a day (TID) | ORAL | 0 refills | Status: DC | PRN

## 2022-12-27 MED ORDER — PANTOPRAZOLE SODIUM 40 MG PO TBEC: 40.0000 mg | DELAYED_RELEASE_TABLET | Freq: Every day | ORAL | 0 refills | Status: DC

## 2022-12-27 NOTE — Telephone Encounter (Signed)
Looks like she was seen in ER yesterday and had poss Mallory weiss tear  Improved and stable, was discharged  Please check in with her to see how she is feeling and schedule f/u

## 2022-12-27 NOTE — Telephone Encounter (Signed)
Per chart review tab pt was seen [facility] ED on 12/26/22. Sending note to Dr Carollee Leitz and FYI to Dr Glori Bickers as PCP.

## 2022-12-27 NOTE — Assessment & Plan Note (Signed)
Triggered by air freshener and patient supersensitive to scents. Can use saline spray or Nettie pot to flush nasal area Strong scent may also trigger nausea. If symptoms worsen can follow-up with PCP.

## 2022-12-27 NOTE — Assessment & Plan Note (Signed)
1 episode of nonbloody explosive diarrhea and 3-4 episodes nonbilious nonbloody emesis that has since resolved.  Continues to remain nauseous intermittently.  Able to tolerate p.o.  Consider viral etiology, possible food sensitivity. Maintain hydration If nausea and vomiting worsens can send in prescription for Zofran. Follow-up with MD if no improvement Strict return precautions provided

## 2022-12-27 NOTE — Patient Instructions (Signed)
It was a pleasure meeting you today. Thank you for allowing me to take part in your health care.  Our goals for today as we discussed include:  Continue current medications  Schedule an appointment for you Medicare annual wellness  Referral sent for Mammogram. Please call to schedule appointment. [facility] [address] [place], [place] [address] [phone]   If you have any questions or concerns, please do not hesitate to call the office at [phone].  I look forward to our next visit and until then take care and stay safe.  Regards,   Zed Wanninger, MD   [place]  

## 2022-12-27 NOTE — Telephone Encounter (Signed)
Left VM requesting pt to call the office back 

## 2022-12-27 NOTE — Discharge Instructions (Signed)
You likely either have a viral gastroenteritis or a foodborne illness (i.e. food poisoning).  Both of these should resolve on their own over the next 1 to 2 days.  The blood like material in the vomit is likely due to a Mallory-Weiss tear which is a small tear in the esophagus.  You have no signs of active internal bleeding at this time and this is expected to resolve on its own.  Take the Protonix as prescribed.  You may take the Zofran as needed for nausea.  We have given you referral to follow-up with a GI doctor.  You should also follow-up with your primary care provider.  Your urine showed red blood cells and some protein.  This can be from various causes but does not appear to be associated to the GI symptoms today.  This should be rechecked by your primary care doctor in the next week.  Return to the ER for new, worsening, or persistent severe vomiting, any further blood in the vomit, abdominal pain, fever weakness or lightheadedness, or any other new or worsening symptoms that concern you.

## 2022-12-27 NOTE — Telephone Encounter (Signed)
[place] Night - Client TELEPHONE ADVICE RECORD [facility] Patient Name: Beth Nielsen [place] Gender: Female DOB: 04-29-1961 Age: 62 Y 28 M 17 D Return Phone Number: [phone] (Primary), [phone] (Secondary) Address: City/ State/ Zip: [place] [address] Client Beth Nielsen Night - Client Client Site [place] Provider Beth Nielsen, Roque Lias - MD Contact Type Call Who Is Calling Patient / Member / Family / Caregiver Call Type Triage / Clinical Relationship To Patient Self Return Phone Number [phone] (Primary) Chief Complaint VOMITING - Blood Reason for Call Symptomatic / Request for [name] states that she just met with one of the DRs at 4PM for a virtual APPT but has just vomit a lot of blood that was red and brown. PT is home alone and is concerned. Translation No Nurse Assessment Nurse: Tanya Nones, RN, Jordan Hawks Date/Time (Eastern Time): 12/26/2022 6:48:35 PM Confirm and document reason for call. If symptomatic, describe symptoms. ---Caller states that she just had a virtual appt at 1600 but has just vomit a lot of blood that was red and brown. She met with the doctor, because she had some strange diarrhea this morning and threw up on the plane ride home. She reports she is also having an allergic reaction from a plug in air freshener. Does the patient have any new or worsening symptoms? ---Yes Will a triage be completed? ---Yes Related visit to physician within the last 2 weeks? ---Yes Does the PT have any chronic conditions? (i.e. diabetes, asthma, this includes High risk factors for pregnancy, etc.) ---No Is this a behavioral health or substance abuse call? ---No Guidelines Guideline Title Affirmed Question Affirmed Notes Nurse Date/Time (Eastern Time) Vomiting Blood [1] Vomited blood AND [2] large amount (example: "a cup of blood") Tanya Nones,  [place], [place] 12/26/2022 6:51:02 PM Disp. Time [name] Time) Disposition Final User 12/26/2022 6:47:38 PM Send to Urgent Queue [name], [name] PLEASE NOTE: All timestamps contained within this report are represented as [place] Standard Time. CONFIDENTIALTY NOTICE: This fax transmission is intended only for the addressee. It contains information that is legally privileged, confidential or otherwise protected from use or disclosure. If you are not the intended recipient, you are strictly prohibited from reviewing, disclosing, copying using or disseminating any of this information or taking any action in reliance on or regarding this information. If you have received this fax in error, please notify us immediately by telephone so that we can arrange for its return to [place]. Phone: [phone], Toll-Free: [phone], Fax: [phone] Page: 2 of 2 Call Id: [id number] [place]. Time [name] Time) Disposition Final User 12/26/2022 6:54:39 PM Call EMS 911 Now Yes Tanya Nones, [place], [place] 12/26/2022 7:01:12 PM Scott, RN, [place] Reason: unable to reach pt-- voicemail left Final Disposition 12/26/2022 6:54:39 PM Call EMS 911 Now Yes Tanya Nones, RN, [name] Disagree/Comply Comply Caller Understands Yes PreDisposition InappropriateToAsk Care Advice Given Per Guideline CALL EMS 911 NOW: * Immediate medical attention is needed. You need to hang up and call 911 (or an ambulance). * Triager Discretion: I'll call you back in a few minutes to be sure you were able to reach them. CARE ADVICE given per Vomiting Blood (Adult) guideline

## 2022-12-28 NOTE — Telephone Encounter (Signed)
Pt said she is still feeling okay, planning on going to work tomorrow. She did r/s her hospital f/u appt from 01/02/23 to tomorrow at 12:30pm, pt said she will need a referral to GI and she is due for her 10 yr colonoscopy and will discuss this with PCP tomorrow at appt.   FYI to PCP

## 2022-12-29 ENCOUNTER — Encounter: Payer: Self-pay | Admitting: Family Medicine

## 2022-12-29 ENCOUNTER — Ambulatory Visit: Payer: BC Managed Care – PPO | Admitting: Family Medicine

## 2022-12-29 VITALS — BP 114/70 | HR 57 | Temp 97.5°F | Ht 64.0 in | Wt 152.2 lb

## 2022-12-29 DIAGNOSIS — Z1211 Encounter for screening for malignant neoplasm of colon: Secondary | ICD-10-CM

## 2022-12-29 DIAGNOSIS — K529 Noninfective gastroenteritis and colitis, unspecified: Secondary | ICD-10-CM

## 2022-12-29 DIAGNOSIS — R8290 Unspecified abnormal findings in urine: Secondary | ICD-10-CM | POA: Diagnosis not present

## 2022-12-29 DIAGNOSIS — K920 Hematemesis: Secondary | ICD-10-CM | POA: Insufficient documentation

## 2022-12-29 LAB — POC URINALSYSI DIPSTICK (AUTOMATED)
Bilirubin, UA: NEGATIVE
Blood, UA: 25 — AB
Glucose, UA: NEGATIVE
Ketones, UA: NEGATIVE
Leukocytes, UA: NEGATIVE
Nitrite, UA: NEGATIVE
Protein, UA: NEGATIVE
Spec Grav, UA: <=1.005 (ref 1.010–1.025)
Urobilinogen, UA: 0.2 E.U./dL
pH, UA: 6 (ref 5.0–8.0)

## 2022-12-29 LAB — POCT UA - MICROSCOPIC ONLY

## 2022-12-29 NOTE — Assessment & Plan Note (Signed)
Ua dop pos for blood but none on micro at all   No urinary symptoms

## 2022-12-29 NOTE — Assessment & Plan Note (Signed)
Suspect viral Seen in ER Reviewed hospital records, lab results and studies in detail    Symptoms are improving  Enc to advance diet slowly/bland at first  Update if any sympt return

## 2022-12-29 NOTE — Patient Instructions (Signed)
See GI at [facility] clinic as planned    Let us know if symptoms worsen again in the meantime   Stay hydrated Advance your diet very slowly for now   Avoid nsaids   Finish the generic protonix and then stop it

## 2022-12-29 NOTE — Progress Notes (Signed)
Subjective:    Patient ID: Beth Nielsen, female    DOB: May 06, 1961, 62 y.o.   MRN: [id number]  HPI Pt presents for f/u of ER visit for vomiting   Wt Readings from Last 3 Encounters:  12/29/22 152 lb 4 oz (69.1 kg)  12/26/22 148 lb (67.1 kg)  12/26/22 151 lb (68.5 kg)   26.13 kg/m  Vitals:   12/29/22 1239  BP: 114/70  Pulse: (!) 57  Temp: (!) 97.5 F (36.4 C)  SpO2: 98%     Seen on 12/26/22 at [facility] ER (after having a virtual visit with Dr Clent Ridges)  Presented with n/v and diarrhea  She noted red color in the vomitus , followed by dark brown (concerned it could be blood)  She stabilized in ER and had no further emesis  No melena or blood in stool   Labs were reassuring  She was tx with fluids and zofran  Also proton ix   Blood was thought to be possible mallory weiss tear   Recommended GI referral   Results for orders placed or performed during the hospital encounter of 12/26/22  Lipase, blood  Result Value Ref Range   Lipase 39 11 - 51 U/L  Comprehensive metabolic panel  Result Value Ref Range   Sodium 135 135 - 145 mmol/L   Potassium 3.5 3.5 - 5.1 mmol/L   Chloride 104 98 - 111 mmol/L   CO2 24 22 - 32 mmol/L   Glucose, Bld 148 (H) 70 - 99 mg/dL   BUN 13 6 - 20 mg/dL   Creatinine, Ser 4.23 0.44 - 1.00 mg/dL   Calcium 8.5 (L) 8.9 - 10.3 mg/dL   Total Protein 7.4 6.5 - 8.1 g/dL   Albumin 4.1 3.5 - 5.0 g/dL   AST 24 15 - 41 U/L   ALT 18 0 - 44 U/L   Alkaline Phosphatase 44 38 - 126 U/L   Total Bilirubin 1.4 (H) 0.3 - 1.2 mg/dL   GFR, Estimated >53 >61 mL/min   Anion gap 7 5 - 15  CBC  Result Value Ref Range   WBC 8.0 4.0 - 10.5 K/uL   RBC 4.35 3.87 - 5.11 MIL/uL   Hemoglobin 13.4 12.0 - 15.0 g/dL   HCT 44.3 15.4 - 00.8 %   MCV 90.3 80.0 - 100.0 fL   MCH 30.8 26.0 - 34.0 pg   MCHC 34.1 30.0 - 36.0 g/dL   RDW 67.6 19.5 - 09.3 %   Platelets 255 150 - 400 K/uL   nRBC 0.0 0.0 - 0.2 %  Urinalysis, Routine w reflex microscopic  Result Value Ref Range    Color, Urine YELLOW (A) YELLOW   APPearance HAZY (A) CLEAR   Specific Gravity, Urine 1.025 1.005 - 1.030   pH 5.0 5.0 - 8.0   Glucose, UA NEGATIVE NEGATIVE mg/dL   Hgb urine dipstick MODERATE (A) NEGATIVE   Bilirubin Urine NEGATIVE NEGATIVE   Ketones, ur 20 (A) NEGATIVE mg/dL   Protein, ur 30 (A) NEGATIVE mg/dL   Nitrite NEGATIVE NEGATIVE   Leukocytes,Ua NEGATIVE NEGATIVE   RBC / HPF 21-50 0 - 5 RBC/hpf   WBC, UA 0-5 0 - 5 WBC/hpf   Bacteria, UA RARE (A) NONE SEEN   Squamous Epithelial / HPF 6-10 0 - 5 /HPF   Mucus PRESENT   CBC with Differential  Result Value Ref Range   WBC 7.7 4.0 - 10.5 K/uL   RBC 4.51 3.87 - 5.11 MIL/uL   Hemoglobin 13.7  12.0 - 15.0 g/dL   HCT 95.1 88.4 - 16.6 %   MCV 91.1 80.0 - 100.0 fL   MCH 30.4 26.0 - 34.0 pg   MCHC 33.3 30.0 - 36.0 g/dL   RDW 06.3 01.6 - 01.0 %   Platelets 142 (L) 150 - 400 K/uL   nRBC 0.0 0.0 - 0.2 %   Neutrophils Relative % 83 %   Neutro Abs 6.3 1.7 - 7.7 K/uL   Lymphocytes Relative 13 %   Lymphs Abs 1.0 0.7 - 4.0 K/uL   Monocytes Relative 4 %   Monocytes Absolute 0.3 0.1 - 1.0 K/uL   Eosinophils Relative 0 %   Eosinophils Absolute 0.0 0.0 - 0.5 K/uL   Basophils Relative 0 %   Basophils Absolute 0.0 0.0 - 0.1 K/uL   Immature Granulocytes 0 %   Abs Immature Granulocytes 0.02 0.00 - 0.07 K/uL    Her last colonoscopy was 01/2013 Is due for screening   Today  Went to work  Really tired  Is getting fluids   Eating bland for now   No more diarrhea  Not really nauseated  Appetite is not back 100%    Ua  Results for orders placed or performed in visit on 12/29/22  POCT Urinalysis Dipstick (Automated)  Result Value Ref Range   Color, UA light Yellow    Clarity, UA Clear    Glucose, UA Negative Negative   Bilirubin, UA Negative    Ketones, UA Negative    Spec Grav, UA <=1.005 1.010 - 1.025   Blood, UA 25 Ery/uL (A)    pH, UA 6.0 5.0 - 8.0   Protein, UA Negative Negative   Urobilinogen, UA 0.2 0.2 or 1.0 E.U./dL    Nitrite, UA Negative    Leukocytes, UA Negative Negative  POCT UA - Microscopic Only  Result Value Ref Range   WBC, Ur, HPF, POC none 0 - 5   RBC, Urine, Miroscopic none 0 - 2   Bacteria, U Microscopic few None - Trace   Mucus, UA few    Epithelial cells, urine per micros few    Crystals, Ur, HPF, POC none    Casts, Ur, LPF, POC none    Yeast, UA none     Patient Active Problem List   Diagnosis Date Noted   Abnormal urinalysis 12/29/2022   Hematemesis 12/29/2022   Colon cancer screening 12/29/2022   Increased sense of smell 12/27/2022   Gastroenteritis 12/27/2022   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/18/2021   Callus of foot 07/18/2021   Allergic rhinitis 04/06/2021   Hyperlipidemia 07/12/2020   Routine general medical examination at a health care facility 07/04/2020   Anxiety disorder 06/30/2019   Hearing decreased 06/30/2019   Past Medical History:  Diagnosis Date   History of chickenpox    History of UTI    Past Surgical History:  Procedure Laterality Date   BLADDER SURGERY  2018   removal of benign growth   ECTOPIC PREGNANCY SURGERY  2003   HYSTEROTOMY  2018   TONSILLECTOMY  1972   WISDOM TOOTH EXTRACTION     age 58   Social History   Tobacco Use   Smoking status: Never   Smokeless tobacco: Never  Substance Use Topics   Alcohol use: Yes    Comment: glass of wine a day   Drug use: Never   Family History  Problem Relation Age of Onset   Alzheimer's disease Mother    Glaucoma Mother  Blindness Mother    Heart failure Father    Hyperlipidemia Father    COPD Brother    Depression Brother    Hyperlipidemia Brother    Arthritis Paternal Grandmother    Breast cancer Neg Hx    No Known Allergies Current Outpatient Medications on File Prior to Visit  Medication Sig Dispense Refill   estradiol (CLIMARA - DOSED IN MG/24 HR) 0.075 mg/24hr patch Place 0.075 mg onto the skin once a week.     ondansetron (ZOFRAN-ODT) 8 MG disintegrating tablet Take  1 tablet (8 mg total) by mouth every 8 (eight) hours as needed for nausea or vomiting. 15 tablet 0   pantoprazole (PROTONIX) 40 MG tablet Take 1 tablet (40 mg total) by mouth daily. 30 tablet 0   rosuvastatin (CRESTOR) 5 MG tablet TAKE 1 TABLET (5 MG TOTAL) BY MOUTH DAILY. 90 tablet 3   venlafaxine XR (EFFEXOR XR) 37.5 MG 24 hr capsule Take 1 capsule (37.5 mg total) by mouth daily with breakfast. 90 capsule 3   No current facility-administered medications on file prior to visit.     Review of Systems  Constitutional:  Positive for appetite change. Negative for activity change, fatigue, fever and unexpected weight change.  HENT:  Negative for congestion, ear pain, rhinorrhea, sinus pressure and sore throat.   Eyes:  Negative for pain, redness and visual disturbance.  Respiratory:  Negative for cough, shortness of breath and wheezing.   Cardiovascular:  Negative for chest pain and palpitations.  Gastrointestinal:  Positive for nausea. Negative for abdominal distention, abdominal pain, anal bleeding, blood in stool, constipation, diarrhea, rectal pain and vomiting.  Endocrine: Negative for polydipsia and polyuria.  Genitourinary:  Negative for dysuria, frequency and urgency.  Musculoskeletal:  Negative for arthralgias, back pain and myalgias.  Skin:  Negative for pallor and rash.  Allergic/Immunologic: Negative for environmental allergies.  Neurological:  Negative for dizziness, syncope and headaches.  Hematological:  Negative for adenopathy. Does not bruise/bleed easily.  Psychiatric/Behavioral:  Negative for decreased concentration and dysphoric mood. The patient is not nervous/anxious.        Objective:   Physical Exam Constitutional:      General: She is not in acute distress.    Appearance: Normal appearance. She is well-developed and normal weight. She is not ill-appearing or diaphoretic.  HENT:     Head: Normocephalic and atraumatic.     Mouth/Throat:     Mouth: Mucous membranes  are moist.     Pharynx: No posterior oropharyngeal erythema.  Eyes:     General: No scleral icterus.    Conjunctiva/sclera: Conjunctivae normal.     Pupils: Pupils are equal, round, and reactive to light.  Cardiovascular:     Rate and Rhythm: Normal rate and regular rhythm.     Heart sounds: Normal heart sounds.  Pulmonary:     Effort: Pulmonary effort is normal. No respiratory distress.     Breath sounds: Normal breath sounds. No wheezing or rales.  Abdominal:     General: Abdomen is flat. Bowel sounds are normal. There is no distension.     Palpations: Abdomen is soft. There is no hepatomegaly, splenomegaly, mass or pulsatile mass.     Tenderness: There is abdominal tenderness in the epigastric area. There is no right CVA tenderness, left CVA tenderness, guarding or rebound. Negative signs include Murphy's sign and McBurney's sign.     Hernia: No hernia is present.     Comments: Very slight epigastric tenderness  Musculoskeletal:  Cervical back: Normal range of motion and neck supple.  Lymphadenopathy:     Cervical: No cervical adenopathy.  Skin:    General: Skin is warm and dry.     Coloration: Skin is not pale.     Findings: No erythema.  Neurological:     Mental Status: She is alert.     Cranial Nerves: No cranial nerve deficit.  Psychiatric:        Mood and Affect: Mood normal.           Assessment & Plan:   Problem List Items Addressed This Visit       Digestive   Gastroenteritis - Primary    Suspect viral Seen in ER Reviewed hospital records, lab results and studies in detail    Symptoms are improving  Enc to advance diet slowly/bland at first  Update if any sympt return        Hematemesis    Seen in ER Reviewed hospital records, lab results and studies in detail  Suspect mallory weiss tear - brief and small with quick resolution   Reassuring hx/exam and vitals today  Will continue protonix until done and stop it  Inst to call asap if  symptoms return  Ref made to GI       Relevant Orders   Ambulatory referral to Gastroenterology     Other   Abnormal urinalysis    Ua dop pos for blood but none on micro at all   No urinary symptoms       Relevant Orders   POCT Urinalysis Dipstick (Automated) (Completed)   POCT UA - Microscopic Only (Completed)   Colon cancer screening    Due for 10 y colonsocopy  Ref to GI to disc scheduling that and also disc recent hematemesis       Relevant Orders   Ambulatory referral to Gastroenterology

## 2022-12-29 NOTE — Assessment & Plan Note (Signed)
Seen in ER Reviewed hospital records, lab results and studies in detail  Suspect mallory weiss tear - brief and small with quick resolution   Reassuring hx/exam and vitals today  Will continue protonix until done and stop it  Inst to call asap if symptoms return  Ref made to GI

## 2022-12-29 NOTE — Assessment & Plan Note (Signed)
Due for 10 y colonsocopy  Ref to GI to disc scheduling that and also disc recent hematemesis

## 2023-01-02 ENCOUNTER — Inpatient Hospital Stay: Payer: BC Managed Care – PPO | Admitting: Family Medicine

## 2023-01-02 ENCOUNTER — Telehealth: Payer: Self-pay | Admitting: Family Medicine

## 2023-01-02 NOTE — Telephone Encounter (Signed)
Thanks for the heads up, she needs re eval by first available  Give ER precautions please (if worse, severe pain, dehydration)  [place]

## 2023-01-02 NOTE — Telephone Encounter (Signed)
Start with lots of fluid and miralax otc (2-3 times daily until bms are better) Stool softener without stimulant laxative is ok also If pain continues however needs to be seen  Watch for n/v fever as well

## 2023-01-02 NOTE — Telephone Encounter (Signed)
Patient called in today stating that she is having cramping in her intestines that are very painful,that's causing her not to eat. She stated that she has had a BM in a week. She did vomit once,but more water than anything due to her not having anything on her stomach. She would like a phone call with advice on if there is anything else she should take to help her ,or come back in to be reevaluated.

## 2023-01-02 NOTE — Telephone Encounter (Signed)
Pt said since phone call she did have a small BM but she is still pretty constipated. Pt said she thinks sxs are due to constipation and she wanted to see if PCP could recommend something OTC for her to take to help have a BM and see if that makes her feel better before coming back in for another appt., please advise

## 2023-01-02 NOTE — Telephone Encounter (Signed)
Pt notified of Dr. Tower's comments and verbalized understanding  

## 2023-01-03 ENCOUNTER — Encounter: Payer: Self-pay | Admitting: Family Medicine

## 2023-04-11 DIAGNOSIS — Z01818 Encounter for other preprocedural examination: Secondary | ICD-10-CM | POA: Diagnosis not present

## 2023-04-11 DIAGNOSIS — Z1211 Encounter for screening for malignant neoplasm of colon: Secondary | ICD-10-CM | POA: Diagnosis not present

## 2023-04-26 DIAGNOSIS — Z79890 Hormone replacement therapy (postmenopausal): Secondary | ICD-10-CM | POA: Diagnosis not present

## 2023-05-10 ENCOUNTER — Ambulatory Visit: Payer: BC Managed Care – PPO

## 2023-05-10 DIAGNOSIS — K640 First degree hemorrhoids: Secondary | ICD-10-CM | POA: Diagnosis not present

## 2023-05-10 DIAGNOSIS — Z1211 Encounter for screening for malignant neoplasm of colon: Secondary | ICD-10-CM | POA: Diagnosis not present

## 2023-07-05 ENCOUNTER — Other Ambulatory Visit: Payer: Self-pay | Admitting: Family Medicine

## 2023-07-05 DIAGNOSIS — Z1231 Encounter for screening mammogram for malignant neoplasm of breast: Secondary | ICD-10-CM

## 2023-07-14 ENCOUNTER — Other Ambulatory Visit: Payer: Self-pay | Admitting: Family Medicine

## 2023-07-19 ENCOUNTER — Encounter (INDEPENDENT_AMBULATORY_CARE_PROVIDER_SITE_OTHER): Payer: Self-pay

## 2023-07-29 ENCOUNTER — Telehealth: Payer: Self-pay | Admitting: Family Medicine

## 2023-07-29 DIAGNOSIS — E7800 Pure hypercholesterolemia, unspecified: Secondary | ICD-10-CM

## 2023-07-29 DIAGNOSIS — Z0000 Encounter for general adult medical examination without abnormal findings: Secondary | ICD-10-CM

## 2023-07-29 NOTE — Telephone Encounter (Signed)
-----   Message from Lovena Neighbours sent at 07/24/2023  3:06 PM EDT ----- Regarding: Labs for 8.26.24 Please put physical fasting lab orders in future. Thank you, Denny Peon

## 2023-07-30 ENCOUNTER — Other Ambulatory Visit (INDEPENDENT_AMBULATORY_CARE_PROVIDER_SITE_OTHER): Payer: BC Managed Care – PPO

## 2023-07-30 ENCOUNTER — Telehealth: Payer: Self-pay | Admitting: Family Medicine

## 2023-07-30 DIAGNOSIS — Z0000 Encounter for general adult medical examination without abnormal findings: Secondary | ICD-10-CM | POA: Diagnosis not present

## 2023-07-30 DIAGNOSIS — E7800 Pure hypercholesterolemia, unspecified: Secondary | ICD-10-CM

## 2023-07-30 LAB — CBC WITH DIFFERENTIAL/PLATELET
Absolute Monocytes: 421 cells/uL (ref 200–950)
Basophils Absolute: 21 cells/uL (ref 0–200)
Basophils Relative: 0.3 %
Eosinophils Absolute: 228 cells/uL (ref 15–500)
Eosinophils Relative: 3.3 %
HCT: 32.7 % — ABNORMAL LOW (ref 35.0–45.0)
Hemoglobin: 11 g/dL — ABNORMAL LOW (ref 11.7–15.5)
Lymphs Abs: 2098 cells/uL (ref 850–3900)
MCH: 30.6 pg (ref 27.0–33.0)
MCHC: 33.6 g/dL (ref 32.0–36.0)
MCV: 91.1 fL (ref 80.0–100.0)
MPV: 10.4 fL (ref 7.5–12.5)
Monocytes Relative: 6.1 %
Neutro Abs: 4133 cells/uL (ref 1500–7800)
Neutrophils Relative %: 59.9 %
Platelets: 103 10*3/uL — ABNORMAL LOW (ref 140–400)
RBC: 3.59 10*6/uL — ABNORMAL LOW (ref 3.80–5.10)
RDW: 12.5 % (ref 11.0–15.0)
Total Lymphocyte: 30.4 %
WBC: 6.9 10*3/uL (ref 3.8–10.8)

## 2023-07-30 LAB — COMPREHENSIVE METABOLIC PANEL
ALT: 16 U/L (ref 0–35)
AST: 24 U/L (ref 0–37)
Albumin: 4 g/dL (ref 3.5–5.2)
Alkaline Phosphatase: 50 U/L (ref 39–117)
BUN: 13 mg/dL (ref 6–23)
CO2: 27 mEq/L (ref 19–32)
Calcium: 9 mg/dL (ref 8.4–10.5)
Chloride: 103 mEq/L (ref 96–112)
Creatinine, Ser: 0.76 mg/dL (ref 0.40–1.20)
GFR: 85.24 mL/min (ref >60.00)
Glucose, Bld: 93 mg/dL (ref 70–99)
Potassium: 4.2 mEq/L (ref 3.5–5.1)
Sodium: 138 mEq/L (ref 135–145)
Total Bilirubin: 0.7 mg/dL (ref 0.2–1.2)
Total Protein: 6.8 g/dL (ref 6.0–8.3)

## 2023-07-30 LAB — LIPID PANEL
Cholesterol: 192 mg/dL (ref 0–200)
HDL: 61.8 mg/dL (ref >39.00)
LDL Cholesterol: 114 mg/dL — ABNORMAL HIGH (ref 0–99)
NonHDL: 129.88
Total CHOL/HDL Ratio: 3
Triglycerides: 80 mg/dL (ref 0.0–149.0)
VLDL: 16 mg/dL (ref 0.0–40.0)

## 2023-07-30 LAB — TSH: TSH: 2.01 u[IU]/mL (ref 0.35–5.50)

## 2023-07-30 NOTE — Telephone Encounter (Signed)
Called left voicemail for patient to come back in for a recollect on her CBC DIFF.

## 2023-07-30 NOTE — Addendum Note (Signed)
Addended by: Alvina Chou on: 07/30/2023 11:26 AM   Modules accepted: Orders

## 2023-07-31 ENCOUNTER — Encounter: Payer: Self-pay | Admitting: Family Medicine

## 2023-07-31 ENCOUNTER — Telehealth: Payer: Self-pay | Admitting: Family Medicine

## 2023-07-31 DIAGNOSIS — Z0000 Encounter for general adult medical examination without abnormal findings: Secondary | ICD-10-CM

## 2023-07-31 DIAGNOSIS — E7800 Pure hypercholesterolemia, unspecified: Secondary | ICD-10-CM

## 2023-07-31 NOTE — Telephone Encounter (Signed)
Attachment says BP was 96/62, hemoglobin was 14 and pulse was 64

## 2023-07-31 NOTE — Telephone Encounter (Signed)
-----   Message from Alvina Chou sent at 07/23/2023 11:27 AM EDT ----- Regarding: Lab orders for Wednesday, 8.28.24 Patient is scheduled for CPX labs, please order future labs, Thanks , Camelia Eng

## 2023-08-01 ENCOUNTER — Other Ambulatory Visit: Payer: BC Managed Care – PPO

## 2023-08-07 ENCOUNTER — Ambulatory Visit
Admission: RE | Admit: 2023-08-07 | Discharge: 2023-08-07 | Disposition: A | Discharge status: Home / Self Care | Payer: BC Managed Care – PPO | Source: Ambulatory Visit | Attending: Family Medicine | Admitting: Family Medicine

## 2023-08-07 DIAGNOSIS — Z1231 Encounter for screening mammogram for malignant neoplasm of breast: Secondary | ICD-10-CM | POA: Diagnosis not present

## 2023-08-08 ENCOUNTER — Ambulatory Visit (INDEPENDENT_AMBULATORY_CARE_PROVIDER_SITE_OTHER): Payer: BC Managed Care – PPO | Admitting: Family Medicine

## 2023-08-08 ENCOUNTER — Encounter: Payer: Self-pay | Admitting: Family Medicine

## 2023-08-08 VITALS — BP 126/80 | HR 66 | Temp 97.6°F | Ht 63.75 in | Wt 154.2 lb

## 2023-08-08 DIAGNOSIS — E7800 Pure hypercholesterolemia, unspecified: Secondary | ICD-10-CM | POA: Diagnosis not present

## 2023-08-08 DIAGNOSIS — Z1211 Encounter for screening for malignant neoplasm of colon: Secondary | ICD-10-CM | POA: Diagnosis not present

## 2023-08-08 DIAGNOSIS — F411 Generalized anxiety disorder: Secondary | ICD-10-CM

## 2023-08-08 DIAGNOSIS — D649 Anemia, unspecified: Secondary | ICD-10-CM

## 2023-08-08 DIAGNOSIS — Z23 Encounter for immunization: Secondary | ICD-10-CM

## 2023-08-08 DIAGNOSIS — Z0000 Encounter for general adult medical examination without abnormal findings: Secondary | ICD-10-CM | POA: Diagnosis not present

## 2023-08-08 HISTORY — DX: Anemia, unspecified: D64.9

## 2023-08-08 MED ORDER — ROSUVASTATIN CALCIUM 5 MG PO TABS: 5.0000 mg | ORAL_TABLET | Freq: Every day | ORAL | 2 refills | Status: DC

## 2023-08-08 NOTE — Assessment & Plan Note (Addendum)
Reviewed health habits including diet and exercise and skin cancer prevention Reviewed appropriate screening tests for age  Also reviewed health mt list, fam hx and immunization status , as well as social and family history   See HPI Labs reviewed and ordered Flu shot today  Mammogram utd 08/2023  Continues low dose HRT/ weaning  Colonoscopy 05/2023 -with 5 y recall  No falls or fractures / encouraged exercise and ca plus D  PHQ 0 Discussed cognitive concerns/word finding today  -discussed good health habits for cognition (is mentally and physically active)

## 2023-08-08 NOTE — Patient Instructions (Addendum)
Keep walking/ moving  Add some strength training to your routine, this is important for bone and brain health and can reduce your risk of falls and help your body use insulin properly and regulate weight  Light weights, exercise bands , and internet videos are a good way to start  Yoga (chair or regular), machines , floor exercises or a gym with machines are also good options    If you want to try coming off of the effexor xr -take it every other day for 2-3 weeks before stopping    For cholesterol Avoid red meat/ fried foods/ egg yolks/ fatty breakfast meats/ butter, cheese and high fat dairy/ and shellfish   If cholesterol trends up we may increase dose of crestor   Cancel blood donation for October   Let's re check cbc in 4-6 weeks    Stay social  Keep brain active   Check out the book Keep sharp by Selmer Dominion

## 2023-08-08 NOTE — Assessment & Plan Note (Signed)
Continues to do well and would like to wean/stop the generic effexor xr  Instructed to take the 37.5 mg dose every other day for 2-3 wk then stop  Discussed some of the expected symptoms   Instructed to update if worse anxiety or issues with weaning  Encouraged good self care/exercise and nutrition as well

## 2023-08-08 NOTE — Progress Notes (Signed)
Subjective:    Patient ID: Beth Nielsen, female    DOB: [birth year], 62 y.o.   MRN: [id number]  HPI  Here for health maintenance exam and to review chronic medical problems   Wt Readings from Last 3 Encounters:  08/08/23 154 lb 4 oz (70 kg)  12/29/22 152 lb 4 oz (69.1 kg)  12/26/22 148 lb (67.1 kg)   26.69 kg/m  Vitals:   08/08/23 1501  BP: 126/80  Pulse: 66  Temp: 97.6 F (36.4 C)  SpO2: 97%    Immunization History  Administered Date(s) Administered   COVID-19, mRNA, vaccine(Comirnaty)12 years and older 09/18/2022   Influenza, Seasonal, Injecte, Preservative Fre 08/08/2023   Influenza,inj,Quad PF,6+ Mos 08/01/2019, 08/24/2020   Influenza-Unspecified 09/18/2022   PFIZER(Purple Top)SARS-COV-2 Vaccination 02/01/2020, 02/23/2020, 09/14/2020   PNEUMOCOCCAL CONJUGATE-20 08/04/2022   Pfizer Covid-19 Vaccine Bivalent Booster 35yrs & up 10/02/2021   Td 02/19/2018   Zoster Recombinant(Shingrix) 08/24/2020, 03/03/2021   Zoster, Live 04/09/2015    There are no preventive care reminders to display for this patient.  Feeling good overall  Good summer  Good self care   Teaches reading    Flu shot - today   Mammogram 08/2023  Self breast exam-no lumps   Gyn health Hysterectomy in the past  On HRT- estradiol patch 0.05 2 times weekly -is weaning this  Doing well so far   Colon cancer screening -colonoscopy 05/2023  5 y recall due to prep issue  Did pill prep and it did not work well   Bone health   Falls-none  Fractures-none  Supplements - calcium plus D  Exercise : walks regularly / gardening/ yard work  [employer] exercise class Some water aerobics  Some golf  Has a zoom class with strength training twice weekly  Likes yoga    Mood    08/08/2023    3:43 PM 12/29/2022   12:47 PM 12/26/2022    3:32 PM 08/04/2022   11:14 AM 07/18/2021    3:57 PM  Depression screen PHQ 2/9  Decreased Interest 0 0 0 0 0  Down, Depressed, Hopeless 0 0 0 0 0  PHQ - 2 Score 0 0  0 0 0  Altered sleeping 0 0  0 0  Tired, decreased energy 0 0  0 0  Change in appetite 0 0  0 0  Feeling bad or failure about yourself  0 0  0 0  Trouble concentrating 0 0  0 0  Moving slowly or fidgety/restless 0 0  0 0  Suicidal thoughts 0 0  0 0  PHQ-9 Score 0 0  0 0  Difficult doing work/chores Not difficult at all Not difficult at all  Not difficult at all Not difficult at all   GAD Effexor xr 37.g daily  Wants to come off of it - ready to wean  Stress is better    Hyperlipidemia  Lab Results  Component Value Date   CHOL 192 07/30/2023   CHOL 188 07/31/2022   CHOL 189 07/12/2021   Lab Results  Component Value Date   HDL 61.80 07/30/2023   HDL 62.10 07/31/2022   HDL 70.20 07/12/2021   Lab Results  Component Value Date   LDLCALC 114 (H) 07/30/2023   LDLCALC 100 (H) 07/31/2022   LDLCALC 102 (H) 07/12/2021   Lab Results  Component Value Date   TRIG 80.0 07/30/2023   TRIG 130.0 07/31/2022   TRIG 84.0 07/12/2021   Lab Results  Component Value Date  CHOLHDL 3 07/30/2023   CHOLHDL 3 07/31/2022   CHOLHDL 3 07/12/2021   No results found for: "LDLDIRECT"  Crestor 5 mg daily  LDL trended up   Hb was down after donating blood  Lab Results  Component Value Date   WBC 6.9 07/30/2023   HGB 11.0 (L) 07/30/2023   HCT 32.7 (L) 07/30/2023   MCV 91.1 07/30/2023   PLT 103 (L) 07/30/2023   Lab Results  Component Value Date   NA 138 07/30/2023   K 4.2 07/30/2023   CO2 27 07/30/2023   GLUCOSE 93 07/30/2023   BUN 13 07/30/2023   CREATININE 0.76 07/30/2023   CALCIUM 9.0 07/30/2023   GFR 85.24 07/30/2023   GFRNONAA >60 12/26/2022   Lab Results  Component Value Date   ALT 16 07/30/2023   AST 24 07/30/2023   ALKPHOS 50 07/30/2023   BILITOT 0.7 07/30/2023   Lab Results  Component Value Date   TSH 2.01 07/30/2023      Patient Active Problem List   Diagnosis Date Noted   Mild anemia 08/08/2023   Colon cancer screening 12/29/2022   Spider veins of both  lower extremities 07/18/2021   Callus of foot 07/18/2021   Allergic rhinitis 04/06/2021   Hyperlipidemia 07/12/2020   Routine general medical examination at a health care facility 07/04/2020   Anxiety disorder 06/30/2019   Hearing decreased 06/30/2019   Past Medical History:  Diagnosis Date   Anxiety 2011   Mild and under control/med management   History of chickenpox    History of UTI    Hyperlipidemia Aug. 2022   Med management-   Mild anemia 08/08/2023   Past Surgical History:  Procedure Laterality Date   ABDOMINAL HYSTERECTOMY  09/20/17   BLADDER SURGERY  2018   removal of benign growth   ECTOPIC PREGNANCY SURGERY  2003   HYSTEROTOMY  2018   TONSILLECTOMY  1972   WISDOM TOOTH EXTRACTION     age 42   Social History   Tobacco Use   Smoking status: Never   Smokeless tobacco: Never  Substance Use Topics   Alcohol use: Yes    Alcohol/week: 9.0 standard drinks of alcohol    Types: 7 Glasses of wine, 2 Standard drinks or equivalent per week    Comment: Wine in evening- occasional cocktails   Drug use: Never   Family History  Problem Relation Age of Onset   Alzheimer's disease Mother    Glaucoma Mother    Blindness Mother    Miscarriages / [place] Mother    Varicose Veins Mother    Heart failure Father    Hyperlipidemia Father    COPD Brother    Depression Brother    Hyperlipidemia Brother    Anxiety disorder Brother    Arthritis Paternal Grandmother    Alcohol abuse Paternal Uncle    Breast cancer Neg Hx    No Known Allergies Current Outpatient Medications on File Prior to Visit  Medication Sig Dispense Refill   CALCIUM-VITAMIN D PO Take 1 capsule by mouth daily.     estradiol (VIVELLE-DOT) 0.05 MG/24HR patch Place 1 patch onto the skin 2 (two) times a week.     venlafaxine XR (EFFEXOR-XR) 37.5 MG 24 hr capsule TAKE 1 CAPSULE BY MOUTH DAILY WITH BREAKFAST. 90 capsule 0   No current facility-administered medications on file prior to visit.    Review  of Systems  Constitutional:  Negative for activity change, appetite change, fatigue, fever and unexpected weight change.  HENT:  Negative for congestion, ear pain, rhinorrhea, sinus pressure and sore throat.   Eyes:  Negative for pain, redness and visual disturbance.  Respiratory:  Negative for cough, shortness of breath and wheezing.   Cardiovascular:  Negative for chest pain and palpitations.  Gastrointestinal:  Negative for abdominal pain, blood in stool, constipation and diarrhea.  Endocrine: Negative for polydipsia and polyuria.  Genitourinary:  Negative for dysuria, frequency and urgency.  Musculoskeletal:  Negative for arthralgias, back pain and myalgias.  Skin:  Negative for pallor and rash.  Allergic/Immunologic: Negative for environmental allergies.  Neurological:  Negative for dizziness, syncope and headaches.       More word finding lately -this worries her   Hematological:  Negative for adenopathy. Does not bruise/bleed easily.  Psychiatric/Behavioral:  Positive for decreased concentration. Negative for confusion and dysphoric mood. The patient is not nervous/anxious.        Objective:   Physical Exam Constitutional:      General: She is not in acute distress.    Appearance: Normal appearance. She is well-developed and normal weight. She is not ill-appearing or diaphoretic.  HENT:     Head: Normocephalic and atraumatic.     Right Ear: Tympanic membrane, ear canal and external ear normal.     Left Ear: Tympanic membrane, ear canal and external ear normal.     Nose: Nose normal. No congestion.     Mouth/Throat:     Mouth: Mucous membranes are moist.     Pharynx: Oropharynx is clear. No posterior oropharyngeal erythema.  Eyes:     General: No scleral icterus.    Extraocular Movements: Extraocular movements intact.     Conjunctiva/sclera: Conjunctivae normal.     Pupils: Pupils are equal, round, and reactive to light.  Neck:     Thyroid: No thyromegaly.     Vascular:  No carotid bruit or JVD.  Cardiovascular:     Rate and Rhythm: Normal rate and regular rhythm.     Pulses: Normal pulses.     Heart sounds: Normal heart sounds.     No gallop.  Pulmonary:     Effort: Pulmonary effort is normal. No respiratory distress.     Breath sounds: Normal breath sounds. No wheezing.     Comments: Good air exch Chest:     Chest wall: No tenderness.  Abdominal:     General: Bowel sounds are normal. There is no distension or abdominal bruit.     Palpations: Abdomen is soft. There is no mass.     Tenderness: There is no abdominal tenderness.     Hernia: No hernia is present.  Genitourinary:    Comments: Breast exam: No mass, nodules, thickening, tenderness, bulging, retraction, inflamation, nipple discharge or skin changes noted.  No axillary or clavicular LA.     Musculoskeletal:        General: No tenderness. Normal range of motion.     Cervical back: Normal range of motion and neck supple. No rigidity. No muscular tenderness.     Right lower leg: No edema.     Left lower leg: No edema.     Comments: No kyphosis   Lymphadenopathy:     Cervical: No cervical adenopathy.  Skin:    General: Skin is warm and dry.     Coloration: Skin is not pale.     Findings: No erythema or rash.     Comments: Solar lentigines diffusely   Neurological:     Mental Status: She is alert.  Mental status is at baseline.     Cranial Nerves: No cranial nerve deficit.     Motor: No abnormal muscle tone.     Coordination: Coordination normal.     Gait: Gait normal.     Deep Tendon Reflexes: Reflexes are normal and symmetric. Reflexes normal.  Psychiatric:        Attention and Perception: Attention normal.        Mood and Affect: Mood normal.        Speech: Speech normal.        Behavior: Behavior normal.        Cognition and Memory: Cognition and memory normal.           Assessment & Plan:   Problem List Items Addressed This Visit       Other   Anxiety disorder     Continues to do well and would like to wean/stop the generic effexor xr  Instructed to take the 37.5 mg dose every other day for 2-3 wk then stop  Discussed some of the expected symptoms   Instructed to update if worse anxiety or issues with weaning  Encouraged good self care/exercise and nutrition as well         Colon cancer screening    Colonoscopy 05/2023 normal but 5 y recall recommended for inoptimal prep      Hyperlipidemia    Disc goals for lipids and reasons to control them Rev last labs with pt Rev low sat fat diet in detail LDL is up slightly- if this continues despite good diet may have to increase crestor dose in future  Currently crestor 5 mg daily and tolerates well Healthy diet       Relevant Medications   rosuvastatin (CRESTOR) 5 MG tablet   Mild anemia    Hb of 11 and plt 103 several days after donating blood  Suspect this is the cause Plan to re check cbc in 4-6 wk with iron    Encouraged to donate less often-based on next labs  No GI symptoms  Had recent colonoscopy      Relevant Orders   CBC with Differential/Platelet   Iron   Routine general medical examination at a health care facility - Primary    Reviewed health habits including diet and exercise and skin cancer prevention Reviewed appropriate screening tests for age  Also reviewed health mt list, fam hx and immunization status , as well as social and family history   See HPI Labs reviewed and ordered Flu shot today  Mammogram utd 08/2023  Continues low dose HRT/ weaning  Colonoscopy 05/2023 -with 5 y recall  No falls or fractures / encouraged exercise and ca plus D  PHQ 0 Discussed cognitive concerns/word finding today  -discussed good health habits for cognition (is mentally and physically active)         Relevant Orders   Flu vaccine trivalent PF, 6mos and older(Flulaval,Afluria,Fluarix,Fluzone) (Completed)   Other Visit Diagnoses     Need for influenza vaccination       Relevant  Orders   Flu vaccine trivalent PF, 6mos and older(Flulaval,Afluria,Fluarix,Fluzone) (Completed)

## 2023-08-08 NOTE — Assessment & Plan Note (Signed)
Hb of 11 and plt 103 several days after donating blood  Suspect this is the cause Plan to re check cbc in 4-6 wk with iron    Encouraged to donate less often-based on next labs  No GI symptoms  Had recent colonoscopy

## 2023-08-08 NOTE — Assessment & Plan Note (Signed)
Disc goals for lipids and reasons to control them Rev last labs with pt Rev low sat fat diet in detail LDL is up slightly- if this continues despite good diet may have to increase crestor dose in future  Currently crestor 5 mg daily and tolerates well Healthy diet

## 2023-08-08 NOTE — Assessment & Plan Note (Signed)
Colonoscopy 05/2023 normal but 5 y recall recommended for inoptimal prep

## 2023-09-04 ENCOUNTER — Telehealth: Payer: BC Managed Care – PPO | Admitting: Family Medicine

## 2023-09-04 ENCOUNTER — Encounter: Payer: Self-pay | Admitting: Family Medicine

## 2023-09-04 DIAGNOSIS — L255 Unspecified contact dermatitis due to plants, except food: Secondary | ICD-10-CM | POA: Insufficient documentation

## 2023-09-04 MED ORDER — PREDNISONE 20 MG PO TABS: ORAL_TABLET | ORAL | 0 refills | Status: DC

## 2023-09-04 NOTE — Progress Notes (Signed)
Virtual Visit via Video Note  I connected with Beth Nielsen on 09/04/23 at  2:30 PM EDT by a video enabled telemedicine application and verified that I am speaking with the correct person using two identifiers.  Location: Patient: home Provider: office    I discussed the limitations of evaluation and management by telemedicine and the availability of in person appointments. The patient expressed understanding and agreed to proceed.  Parties involved in encounter  Patient: Beth Nielsen  Provider:  Roxy Manns MD   History of Present Illness: Pt presents with c/o poison ivy dermatitis   Was exposed to it 2 weeks ago -pulling leaves  Itchy rash   Started as a small area then got larger and very itchy  Scattered areas   Started on left hand -finger then wrist and upper arm  Then lower thigh area and back of knee  Looked like blisters  Now both arms   Some bruising from scratching   Over the counter Rubbing slcohol Calamine Hydrocortisone  Ivy rest  Hydrogen perioxide  Zyrtec-took one  Took tylenol   ? If she has washed everything that came in contact     Patient Active Problem List   Diagnosis Date Noted   Plant dermatitis 09/04/2023   Mild anemia 08/08/2023   Colon cancer screening 12/29/2022   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/18/2021   Callus of foot 07/18/2021   Allergic rhinitis 04/06/2021   Hyperlipidemia 07/12/2020   Routine general medical examination at a health care facility 07/04/2020   Anxiety disorder 06/30/2019   Hearing decreased 06/30/2019   Past Medical History:  Diagnosis Date   Anxiety 2011   Mild and under control/med management   History of chickenpox    History of UTI    Hyperlipidemia Aug. 2022   Med management-   Mild anemia 08/08/2023   Past Surgical History:  Procedure Laterality Date   ABDOMINAL HYSTERECTOMY  09/20/17   BLADDER SURGERY  2018   removal of benign growth   ECTOPIC PREGNANCY SURGERY  2003    HYSTEROTOMY  2018   TONSILLECTOMY  1972   WISDOM TOOTH EXTRACTION     age 32   Social History   Tobacco Use   Smoking status: Never   Smokeless tobacco: Never  Substance Use Topics   Alcohol use: Yes    Alcohol/week: 9.0 standard drinks of alcohol    Types: 7 Glasses of wine, 2 Standard drinks or equivalent per week    Comment: Wine in evening- occasional cocktails   Drug use: Never   Family History  Problem Relation Age of Onset   Alzheimer's disease Mother    Glaucoma Mother    Blindness Mother    Miscarriages / [place] Mother    Varicose Veins Mother    Heart failure Father    Hyperlipidemia Father    COPD Brother    Depression Brother    Hyperlipidemia Brother    Anxiety disorder Brother    Arthritis Paternal Grandmother    Alcohol abuse Paternal Uncle    Breast cancer Neg Hx    No Known Allergies Current Outpatient Medications on File Prior to Visit  Medication Sig Dispense Refill   CALCIUM-VITAMIN D PO Take 1 capsule by mouth daily.     estradiol (VIVELLE-DOT) 0.05 MG/24HR patch Place 1 patch onto the skin 2 (two) times a week.     rosuvastatin (CRESTOR) 5 MG tablet Take 1 tablet (5 mg total) by mouth daily. 90 tablet 2  venlafaxine XR (EFFEXOR-XR) 37.5 MG 24 hr capsule TAKE 1 CAPSULE BY MOUTH DAILY WITH BREAKFAST. 90 capsule 0   No current facility-administered medications on file prior to visit.   Review of Systems  Constitutional:  Negative for chills, fever and malaise/fatigue.  HENT:  Negative for congestion, ear pain, sinus pain and sore throat.   Eyes:  Negative for blurred vision, discharge and redness.  Respiratory:  Negative for cough, shortness of breath and stridor.   Cardiovascular:  Negative for chest pain, palpitations and leg swelling.  Gastrointestinal:  Negative for abdominal pain, diarrhea, nausea and vomiting.  Musculoskeletal:  Negative for myalgias.  Skin:  Negative for rash.  Neurological:  Negative for dizziness and headaches.     Observations/Objective: Patient appears well, in no distress Weight is baseline  No facial swelling or asymmetry Normal voice-not hoarse and no slurred speech No obvious tremor or mobility impairment Moving neck and UEs normally Able to hear the call well  No cough or shortness of breath during interview  Talkative and mentally sharp with no cognitive changes Rash noted - bilat arms (moreso on r lower arm), also right inner thigh- vesicular/erythematous and in linear pattern  Some bruising of right thigh from trauma of scratching  Affect is normal    Assessment and Plan: Problem List Items Addressed This Visit       Musculoskeletal and Integument   Plant dermatitis - Primary    Scattered areas on arms and inner right thigh since exposure about 2 weeks ago  Healing but severe itching  No signs and symptoms of infection   Prescription prednisone 40 mg , taper to 20  Discussed possible side effects  Instructed to  Keep clean with soap/water  Avoid scratching Continue antihistamine like zyrtec daily  Update if not starting to improve in a week or if worsening  Call back and Er precautions noted in detail today          Follow Up Instructions: Keep affected areas clean with soap and water  Stay cool /avoid hot water or hot conditions   Take and antihistamine like zyrtec daily as needed Take prednisone as directed   Wash any clothing or tools that came into contact with the plant  If needed-hire someone to get it out of yard or garden  Update if not starting to improve in a week or if worsening    I discussed the assessment and treatment plan with the patient. The patient was provided an opportunity to ask questions and all were answered. The patient agreed with the plan and demonstrated an understanding of the instructions.   The patient was advised to call back or seek an in-person evaluation if the symptoms worsen or if the condition fails to improve as  anticipated.     Roxy Manns, MD

## 2023-09-04 NOTE — Patient Instructions (Signed)
Keep affected areas clean with soap and water  Stay cool /avoid hot water or hot conditions   Take and antihistamine like zyrtec daily as needed Take prednisone as directed   Wash any clothing or tools that came into contact with the plant  If needed-hire someone to get it out of yard or garden  Update if not starting to improve in a week or if worsening

## 2023-09-04 NOTE — Assessment & Plan Note (Signed)
Scattered areas on arms and inner right thigh since exposure about 2 weeks ago  Healing but severe itching  No signs and symptoms of infection   Prescription prednisone 40 mg , taper to 20  Discussed possible side effects  Instructed to  Keep clean with soap/water  Avoid scratching Continue antihistamine like zyrtec daily  Update if not starting to improve in a week or if worsening  Call back and Er precautions noted in detail today

## 2023-09-16 ENCOUNTER — Encounter: Payer: Self-pay | Admitting: Family Medicine

## 2023-09-19 ENCOUNTER — Other Ambulatory Visit: Payer: BC Managed Care – PPO

## 2023-10-10 ENCOUNTER — Other Ambulatory Visit (INDEPENDENT_AMBULATORY_CARE_PROVIDER_SITE_OTHER): Payer: BC Managed Care – PPO

## 2023-10-10 DIAGNOSIS — D649 Anemia, unspecified: Secondary | ICD-10-CM

## 2023-10-11 ENCOUNTER — Other Ambulatory Visit (INDEPENDENT_AMBULATORY_CARE_PROVIDER_SITE_OTHER): Payer: BC Managed Care – PPO

## 2023-10-11 DIAGNOSIS — D649 Anemia, unspecified: Secondary | ICD-10-CM | POA: Diagnosis not present

## 2023-10-11 LAB — IRON: Iron: 86 ug/dL (ref 42–145)

## 2023-10-11 NOTE — Addendum Note (Signed)
Addended by: Alvina Chou on: 10/11/2023 12:00 PM   Modules accepted: Orders

## 2023-10-12 LAB — CBC WITH DIFFERENTIAL/PLATELET
Basophils Absolute: 0.1 10*3/uL (ref 0.0–0.1)
Basophils Relative: 0.8 % (ref 0.0–3.0)
Eosinophils Absolute: 0.1 10*3/uL (ref 0.0–0.7)
Eosinophils Relative: 1.7 % (ref 0.0–5.0)
HCT: 38.6 % (ref 36.0–46.0)
Hemoglobin: 13.1 g/dL (ref 12.0–15.0)
Lymphocytes Relative: 31.5 % (ref 12.0–46.0)
Lymphs Abs: 2.3 10*3/uL (ref 0.7–4.0)
MCHC: 34 g/dL (ref 30.0–36.0)
MCV: 91.7 fL (ref 78.0–100.0)
Monocytes Absolute: 0.5 10*3/uL (ref 0.1–1.0)
Monocytes Relative: 7.6 % (ref 3.0–12.0)
Neutro Abs: 4.2 10*3/uL (ref 1.4–7.7)
Neutrophils Relative %: 58.4 % (ref 43.0–77.0)
RBC: 4.21 Mil/uL (ref 3.87–5.11)
RDW: 12.4 % (ref 11.5–15.5)
WBC: 7.2 10*3/uL (ref 4.0–10.5)

## 2023-10-15 ENCOUNTER — Ambulatory Visit: Payer: BC Managed Care – PPO | Admitting: Family Medicine

## 2023-10-15 VITALS — BP 108/64 | HR 64 | Temp 97.8°F | Ht 63.75 in | Wt 155.1 lb

## 2023-10-15 DIAGNOSIS — M25511 Pain in right shoulder: Secondary | ICD-10-CM | POA: Diagnosis not present

## 2023-10-15 DIAGNOSIS — M79671 Pain in right foot: Secondary | ICD-10-CM | POA: Diagnosis not present

## 2023-10-15 DIAGNOSIS — G8929 Other chronic pain: Secondary | ICD-10-CM | POA: Diagnosis not present

## 2023-10-15 NOTE — Assessment & Plan Note (Signed)
Notes this when working with weights and lifting  Possible tendonitis  Fairly good rom   Will arrange sport med visit  Encouraged to avoid the exercises that hurt

## 2023-10-15 NOTE — Progress Notes (Signed)
Subjective:    Patient ID: Beth Nielsen, female    DOB: 1961/06/26, 62 y.o.   MRN: [id number]  HPI  Wt Readings from Last 3 Encounters:  10/15/23 155 lb 2 oz (70.4 kg)  08/08/23 154 lb 4 oz (70 kg)  12/29/22 152 lb 4 oz (69.1 kg)   26.84 kg/m  Vitals:   10/15/23 1500  BP: 108/64  Pulse: 64  Temp: 97.8 F (36.6 C)  SpO2: 97%    Pt presents with c/o foot pain  Some right shoulder pain    Pain in right foot  Ball of foot /underside  Radiates to her toe  Occational aches at night   A little better the past week  Wearing better shoes   No redness No swelling No tenderness to touch   Hurts when propelling forward in gait   Padded shoes feel best  Has to avoid heels  Recently cut back on her estrogen dose  Does not sleep as well   Patient Active Problem List   Diagnosis Date Noted   Foot pain, right 10/15/2023   Right shoulder pain 10/15/2023   Plant dermatitis 09/04/2023   Mild anemia 08/08/2023   Colon cancer screening 12/29/2022   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/18/2021   Callus of foot 07/18/2021   Allergic rhinitis 04/06/2021   Hyperlipidemia 07/12/2020   Routine general medical examination at a health care facility 07/04/2020   Anxiety disorder 06/30/2019   Hearing decreased 06/30/2019   Past Medical History:  Diagnosis Date   Anxiety 2011   Mild and under control/med management   History of chickenpox    History of UTI    Hyperlipidemia Aug. 2022   Med management-   Mild anemia 08/08/2023   Past Surgical History:  Procedure Laterality Date   ABDOMINAL HYSTERECTOMY  09/20/17   BLADDER SURGERY  2018   removal of benign growth   ECTOPIC PREGNANCY SURGERY  2003   HYSTEROTOMY  2018   TONSILLECTOMY  1972   WISDOM TOOTH EXTRACTION     age 8   Social History   Tobacco Use   Smoking status: Never   Smokeless tobacco: Never  Substance Use Topics   Alcohol use: Yes    Alcohol/week: 9.0 standard drinks of alcohol    Types: 7  Glasses of wine, 2 Standard drinks or equivalent per week    Comment: Wine in evening- occasional cocktails   Drug use: Never   Family History  Problem Relation Age of Onset   Alzheimer's disease Mother    Glaucoma Mother    Blindness Mother    Miscarriages / [place] Mother    Varicose Veins Mother    Heart failure Father    Hyperlipidemia Father    COPD Brother    Depression Brother    Hyperlipidemia Brother    Anxiety disorder Brother    Arthritis Paternal Grandmother    Alcohol abuse Paternal Uncle    Breast cancer Neg Hx    No Known Allergies Current Outpatient Medications on File Prior to Visit  Medication Sig Dispense Refill   CALCIUM-VITAMIN D PO Take 1 capsule by mouth daily.     estradiol (VIVELLE-DOT) 0.05 MG/24HR patch Place 1 patch onto the skin 2 (two) times a week.     rosuvastatin (CRESTOR) 5 MG tablet Take 1 tablet (5 mg total) by mouth daily. 90 tablet 2   No current facility-administered medications on file prior to visit.    Review of Systems  Objective:   Physical Exam Constitutional:      General: She is not in acute distress.    Appearance: Normal appearance. She is normal weight. She is not ill-appearing or diaphoretic.  Eyes:     Conjunctiva/sclera: Conjunctivae normal.     Pupils: Pupils are equal, round, and reactive to light.  Cardiovascular:     Rate and Rhythm: Normal rate and regular rhythm.     Pulses: Normal pulses.  Musculoskeletal:     Comments: Mild medial bunion deformity right first MTP joint  No crepitus No erythema or warmth or ttp   Some pain with walking    Right shoulder Normal rom (int/ext rot)  Some discomfort abducting past 90 deg Positive hawking's test for anterior/superior shoulder pain No crepitus   Skin:    General: Skin is warm and dry.     Findings: No bruising or erythema.  Neurological:     Mental Status: She is alert.     Sensory: No sensory deficit.  Psychiatric:        Mood and Affect:  Mood normal.           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

## 2023-10-15 NOTE — Patient Instructions (Addendum)
I put the referral in for podiatry (foot specialist)  Please let us know if you don't hear in 1-2 weeks    Wear supportive shoes that are wide enough    Try voltaren gel for both foot and shoulder  It is over the counter 1%    Use ice also    You may have some shoulder tendonitis  Stop at check out to get appointment with Dr Patsy Lager

## 2023-10-15 NOTE — Assessment & Plan Note (Signed)
Suspect hallux rigidis   Reassuring exam  Encouraged wide shoes that are supportive  Voltaren gel 1% over the counter prn  Ice prn   Ref to podiatry to discuss conservative measures/ shoe wear and consider imaging

## 2023-10-16 NOTE — Progress Notes (Signed)
    Beth Nielsen T. Bridgett Hattabaugh, MD, CAQ Sports Medicine [facility] at [facility] [address] [place], [address]  Phone: [phone]  FAX: [phone]  Beth Nielsen - 62 y.o. female  MRN [id number]  Date of Birth: 07-05-1961  Date: 10/18/2023  PCP: Judy Pimple, MD  Referral: Judy Pimple, MD  Chief Complaint  Patient presents with   Shoulder Pain    Right shoulder pain denies any injury x 1 month    Subjective:   This 62 y.o. female patient noted above presents with shoulder pain that has been ongoing for 1 mo there is no history of trauma or accident recently The patient denies neck pain or radicular symptoms. Denies dislocation, subluxation, separation of the shoulder. The patient does complain of pain in the overhead plane with significant painful arc of motion.  She does walk for exercise and does gardening as the past time  Medications Tried: Tylenol Ice or Heat: minimally helpful Tried PT: No  Prior shoulder Injury: No Prior surgery: No Prior fracture: No   Review of Systems is noted in the HPI, as appropriate  Objective:   Blood pressure 120/78, pulse 66, temperature 98.7 F (37.1 C), height 5' 3.75" (1.619 m), weight 155 lb (70.3 kg), SpO2 98%.  Shoulder: R Inspection: No muscle wasting or winging Ecchymosis/edema: neg  AC joint, scapula, clavicle: NT Cervical spine: NT, full ROM Spurling's: neg Abduction: full, 5/5, painful arc of motion Flexion: full, 5/5 IR, full, lift-off: 5/5 ER at neutral: full, 5/5 AC crossover: Positive Neer: pos Hawkins: pos Drop Test: neg Empty Can: pos Supraspinatus insertion: mild-mod T Bicipital groove: NT Speed's: neg Yergason's: neg Sulcus sign: neg Scapular dyskinesis: none C5-T1 intact  Neuro: Sensation intact Grip 5/5   Radiology: No results found.  Assessment and Plan:      ICD-10-CM   1. Rotator cuff tendonitis, right  M75.81      I am going to have her start some  NSAIDs and do a rotator cuff program, throwers 12.  Rotator cuff strengthening and scapular stabilization exercises were reviewed with the patient.  RTC and scapular stabilization and shoulder protocol given to the patient. Retraining shoulder mechanics and function was emphasized to the patient with rehab done ideally 5-6 days a week.  The patient could benefit ideally from formal PT to assist with scapular stabilization and RTC strengthening if symptoms persist.   Follow-up: No follow-ups on file.  No orders of the defined types were placed in this encounter.  No orders of the defined types were placed in this encounter.   Signed,  Elpidio Galea. Talayia Hjort, MD   Patient's Medications  New Prescriptions   No medications on file  Previous Medications   CALCIUM-VITAMIN D PO    Take 1 capsule by mouth daily.   ESTRADIOL (VIVELLE-DOT) 0.05 MG/24HR PATCH    Place 1 patch onto the skin 2 (two) times a week.   ROSUVASTATIN (CRESTOR) 5 MG TABLET    Take 1 tablet (5 mg total) by mouth daily.  Modified Medications   No medications on file  Discontinued Medications   No medications on file

## 2023-10-18 ENCOUNTER — Ambulatory Visit: Payer: BC Managed Care – PPO | Admitting: Family Medicine

## 2023-10-18 ENCOUNTER — Encounter: Payer: Self-pay | Admitting: Family Medicine

## 2023-10-18 VITALS — BP 120/78 | HR 66 | Temp 98.7°F | Ht 63.75 in | Wt 155.0 lb

## 2023-10-18 DIAGNOSIS — M7581 Other shoulder lesions, right shoulder: Secondary | ICD-10-CM | POA: Diagnosis not present

## 2023-11-02 ENCOUNTER — Encounter: Payer: Self-pay | Admitting: Family Medicine

## 2023-11-14 ENCOUNTER — Telehealth: Payer: BC Managed Care – PPO | Admitting: Family Medicine

## 2023-11-14 ENCOUNTER — Ambulatory Visit: Payer: BC Managed Care – PPO | Admitting: Family Medicine

## 2023-11-14 ENCOUNTER — Encounter: Payer: Self-pay | Admitting: Family Medicine

## 2023-11-14 VITALS — BP 110/64 | HR 65 | Temp 98.1°F | Ht 63.75 in | Wt 154.5 lb

## 2023-11-14 DIAGNOSIS — F5104 Psychophysiologic insomnia: Secondary | ICD-10-CM | POA: Diagnosis not present

## 2023-11-14 DIAGNOSIS — F411 Generalized anxiety disorder: Secondary | ICD-10-CM | POA: Diagnosis not present

## 2023-11-14 DIAGNOSIS — G4700 Insomnia, unspecified: Secondary | ICD-10-CM | POA: Insufficient documentation

## 2023-11-14 MED ORDER — VENLAFAXINE HCL ER 37.5 MG PO CP24: 37.5000 mg | ORAL_CAPSULE | Freq: Every day | ORAL | 1 refills | Status: DC

## 2023-11-14 NOTE — Assessment & Plan Note (Signed)
Suspect in part due to increase in anxiety after stopping effexor xr  Discussed sleep hygiene and exercise habits Handout given  Discussed opt for treatment   Will start back on effexor xr 37.5 mg daily  If not improved in several weeks -opt to go back to 75 mg daily   Instructed to update if not improving

## 2023-11-14 NOTE — Progress Notes (Signed)
Subjective:    Patient ID: Beth Nielsen, female    DOB: 1961/06/03, 62 y.o.   MRN: [id number]  HPI  Wt Readings from Last 3 Encounters:  11/14/23 154 lb 8 oz (70.1 kg)  10/18/23 155 lb (70.3 kg)  10/15/23 155 lb 2 oz (70.4 kg)   26.73 kg/m  Vitals:   11/14/23 1430  BP: 110/64  Pulse: 65  Temp: 98.1 F (36.7 C)  SpO2: 98%   Pt presents with sleeping problem  Nose feels different    Used to take effexor in the past for anx disorder - tapered off and then stopped in October  Then started having sleep problems    Trouble falling asleep and staying asleep Can take up to 2 hours to fall asleep Rolls over and wakes up multiple times but can go back to sleep  Does not feel like brain is busy or overactive     Over the counter Advil  Tried benadryl for 2 nights-it helped some   On cup of coffee in the am   Sleep deprived - is causing irritability and lack of motiavation   ? 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 2022   Med management-   Mild anemia  08/08/2023   Past Surgical History:  Procedure Laterality Date   ABDOMINAL HYSTERECTOMY  09/20/17   BLADDER SURGERY  2018   removal of benign growth   ECTOPIC PREGNANCY SURGERY  2003   HYSTEROTOMY  2018   TONSILLECTOMY  1972   WISDOM TOOTH EXTRACTION     age 30   Social History   Tobacco Use   Smoking status: Never   Smokeless tobacco: Never  Substance Use Topics   Alcohol use: Yes    Alcohol/week: 9.0 standard drinks of alcohol    Types: 7 Glasses of wine, 2 Standard drinks or equivalent per week    Comment: Wine in evening- occasional cocktails   Drug use: Never   Family History  Problem Relation Age of Onset   Alzheimer's disease Mother    Glaucoma Mother    Blindness Mother    Miscarriages / [place] Mother    Varicose Veins Mother    Heart failure Father    Hyperlipidemia Father    COPD Brother    Depression Brother    Hyperlipidemia Brother    Anxiety disorder Brother    Arthritis Paternal Grandmother    Alcohol abuse Paternal Uncle  Breast cancer Neg Hx    No Known Allergies Current Outpatient Medications on File Prior to Visit  Medication Sig Dispense Refill   CALCIUM-VITAMIN D PO Take 1 capsule by mouth daily.     estradiol (VIVELLE-DOT) 0.05 MG/24HR patch Place 1 patch onto the skin 2 (two) times a week.     rosuvastatin (CRESTOR) 5 MG tablet Take 1 tablet (5 mg total) by mouth daily. 90 tablet 2   No current facility-administered medications on file prior to visit.    Review of Systems  Constitutional:  Positive for fatigue. Negative for activity change, appetite change, fever and unexpected weight change.  HENT:  Negative for congestion, ear pain, rhinorrhea, sinus pressure and sore throat.   Eyes:  Negative for pain, redness and visual disturbance.  Respiratory:  Negative for cough, shortness of breath and wheezing.   Cardiovascular:  Negative for chest pain and palpitations.  Gastrointestinal:  Negative for abdominal pain, blood in stool,  constipation and diarrhea.  Endocrine: Negative for polydipsia and polyuria.  Genitourinary:  Negative for dysuria, frequency and urgency.  Musculoskeletal:  Negative for arthralgias, back pain and myalgias.  Skin:  Negative for pallor and rash.  Allergic/Immunologic: Negative for environmental allergies.  Neurological:  Negative for dizziness, syncope and headaches.  Hematological:  Negative for adenopathy. Does not bruise/bleed easily.  Psychiatric/Behavioral:  Positive for dysphoric mood and sleep disturbance. Negative for decreased concentration, self-injury and suicidal ideas. The patient is nervous/anxious.        Irritability       Objective:   Physical Exam Constitutional:      General: She is not in acute distress.    Appearance: Normal appearance. She is well-developed and normal weight. She is not ill-appearing or diaphoretic.  HENT:     Head: Normocephalic and atraumatic.  Eyes:     Conjunctiva/sclera: Conjunctivae normal.     Pupils: Pupils are equal, round, and reactive to light.  Neck:     Thyroid: No thyromegaly.     Vascular: No carotid bruit or JVD.  Cardiovascular:     Rate and Rhythm: Normal rate and regular rhythm.     Heart sounds: Normal heart sounds.     No gallop.  Pulmonary:     Effort: Pulmonary effort is normal. No respiratory distress.     Breath sounds: Normal breath sounds. No wheezing or rales.  Abdominal:     General: There is no distension or abdominal bruit.     Palpations: Abdomen is soft.  Musculoskeletal:     Cervical back: Normal range of motion and neck supple.     Right lower leg: No edema.     Left lower leg: No edema.  Lymphadenopathy:     Cervical: No cervical adenopathy.  Skin:    General: Skin is warm and dry.     Coloration: Skin is not pale.     Findings: No rash.  Neurological:     Mental Status: She is alert.     Coordination: Coordination normal.     Deep Tendon Reflexes: Reflexes are normal and symmetric. Reflexes  normal.  Psychiatric:        Attention and Perception: Attention normal.        Mood and Affect: Mood is anxious.        Speech: Speech normal.        Cognition and Memory: Cognition and memory normal.     Comments: Candidly discusses symptoms and stressors  Assessment & Plan:   Problem List Items Addressed This Visit       Other   Insomnia - Primary    Suspect in part due to increase in anxiety after stopping effexor xr  Discussed sleep hygiene and exercise habits Handout given  Discussed opt for treatment   Will start back on effexor xr 37.5 mg daily  If not improved in several weeks -opt to go back to 75 mg daily   Instructed to update if not improving       Anxiety disorder    Worsened Orinda Kenner with insomnia and irritability since stopping effexor xr  Reviewed stressors/ coping techniques/symptoms/ support sources/ tx options and side effects in detail today   Will start back on effexor xr 37.5 mg daily  Discussed expectations of SNRI medication including time to effectiveness and mechanism of action, also poss of side effects (early and late)- including mental fuzziness, weight or appetite change, nausea and poss of worse dep or anxiety (even suicidal thoughts)  Pt voiced understanding and will stop med and update if this occurs   Option to increase back to 75 mg later if not improving        Relevant Medications   venlafaxine XR (EFFEXOR XR) 37.5 MG 24 hr capsule

## 2023-11-14 NOTE — Assessment & Plan Note (Signed)
Worsened Orinda Kenner with insomnia and irritability since stopping effexor xr  Reviewed stressors/ coping techniques/symptoms/ support sources/ tx options and side effects in detail today   Will start back on effexor xr 37.5 mg daily  Discussed expectations of SNRI medication including time to effectiveness and mechanism of action, also poss of side effects (early and late)- including mental fuzziness, weight or appetite change, nausea and poss of worse dep or anxiety (even suicidal thoughts)  Pt voiced understanding and will stop med and update if this occurs   Option to increase back to 75 mg later if not improving

## 2023-11-14 NOTE — Patient Instructions (Addendum)
Follow sleep hygiene rules the best you can   Take care of yourself   Cut caffeine the best you can   Start back on effexor xr 37.5 mg once daily  If we need to go up on the dose let us know in about a month   Melatonin is good to have on hand for jet lag with travel   Get back to exercise when you can   Stay active Add some strength training to your routine, this is important for bone and brain health and can reduce your risk of falls and help your body use insulin properly and regulate weight  Light weights, exercise bands , and internet videos are a good way to start  Yoga (chair or regular), machines , floor exercises or a gym with machines are also good options

## 2023-12-19 ENCOUNTER — Ambulatory Visit: Payer: BC Managed Care – PPO | Admitting: Podiatry

## 2024-01-06 ENCOUNTER — Encounter: Payer: Self-pay | Admitting: Family Medicine

## 2024-01-09 ENCOUNTER — Ambulatory Visit: Payer: BC Managed Care – PPO | Admitting: Podiatry

## 2024-01-09 ENCOUNTER — Ambulatory Visit (INDEPENDENT_AMBULATORY_CARE_PROVIDER_SITE_OTHER): Payer: BC Managed Care – PPO

## 2024-01-09 ENCOUNTER — Encounter: Payer: Self-pay | Admitting: Podiatry

## 2024-01-09 DIAGNOSIS — Q6672 Congenital pes cavus, left foot: Secondary | ICD-10-CM

## 2024-01-09 DIAGNOSIS — M7752 Other enthesopathy of left foot: Secondary | ICD-10-CM | POA: Diagnosis not present

## 2024-01-09 DIAGNOSIS — M25879 Other specified joint disorders, unspecified ankle and foot: Secondary | ICD-10-CM

## 2024-01-09 DIAGNOSIS — M7751 Other enthesopathy of right foot: Secondary | ICD-10-CM | POA: Diagnosis not present

## 2024-01-09 DIAGNOSIS — Q6671 Congenital pes cavus, right foot: Secondary | ICD-10-CM | POA: Diagnosis not present

## 2024-01-09 NOTE — Progress Notes (Signed)
 Subjective:  Patient ID: Beth Nielsen, female    DOB: [birth year],  MRN: [id number] HPI Chief Complaint  Patient presents with   Foot Pain    Sub 1st MPJ bilateral (R>L) - aching with long walks x few months, radiates across dorsal forefoot sometimes, wears supportive shoes, takes advil PRN   New Patient (Initial Visit)    63 y.o. female presents with the above complaint.   ROS: Denies fever chills nausea vomit muscle aches pains calf pain back pain chest pain shortness of breath.  Past Medical History:  Diagnosis Date   Anxiety 2011   Mild and under control/med management   History of chickenpox    History of UTI    Hyperlipidemia Aug. 2022   Med management-   Mild anemia 08/08/2023   Past Surgical History:  Procedure Laterality Date   ABDOMINAL HYSTERECTOMY  09/20/17   BLADDER SURGERY  2018   removal of benign growth   ECTOPIC PREGNANCY SURGERY  2003   HYSTEROTOMY  2018   TONSILLECTOMY  1972   WISDOM TOOTH EXTRACTION     age 54    Current Outpatient Medications:    CALCIUM -VITAMIN D PO, Take 1 capsule by mouth daily., Disp: , Rfl:    estradiol  (VIVELLE -DOT) 0.05 MG/24HR patch, Place 1 patch onto the skin 2 (two) times a week., Disp: , Rfl:    rosuvastatin  (CRESTOR ) 5 MG tablet, Take 1 tablet (5 mg total) by mouth daily., Disp: 90 tablet, Rfl: 2   venlafaxine  XR (EFFEXOR  XR) 37.5 MG 24 hr capsule, Take 1 capsule (37.5 mg total) by mouth daily with breakfast., Disp: 90 capsule, Rfl: 1  No Known Allergies Review of Systems Objective:  There were no vitals filed for this visit.  General: Well developed, nourished, in no acute distress, alert and oriented x3   Dermatological: Skin is warm, dry and supple bilateral. Nails x 10 are well maintained; remaining integument appears unremarkable at this time. There are no open sores, no preulcerative lesions, no rash or signs of infection present.  She has a palpable bursa beneath the tibial sesamoid to the plantar medial aspect  of the tibial sesamoid of the first metatarsal phalangeal joint bilateral right greater than left  Vascular: Dorsalis Pedis artery and Posterior Tibial artery pedal pulses are 2/4 bilateral with immedate capillary fill time. Pedal hair growth present. No varicosities and no lower extremity edema present bilateral.   Neruologic: Grossly intact via light touch bilateral. Vibratory intact via tuning fork bilateral. Protective threshold with Semmes Wienstein monofilament intact to all pedal sites bilateral. Patellar and Achilles deep tendon reflexes 2+ bilateral. No Babinski or clonus noted bilateral.   Musculoskeletal: No gross boney pedal deformities bilateral. No pain, crepitus, or limitation noted with foot and ankle range of motion bilateral. Muscular strength 5/5 in all groups tested bilateral.  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  Pain on palpation and range of motion of the sesamoids particularly the tibial sesamoid on the right side.  Gait: Unassisted, Nonantalgic.    Radiographs:  Radiographs taken today 3 views bilateral demonstrate generally good mineralized bone osseously mature individual.  Mild hallux abductovalgus deformity with what appears to be a bipartite or fractured sesamoid t tibial first metatarsal phalangeal joint right foot as opposed to left.  Left foot does not demonstrate a bipartite sesamoid at all.  Soft tissue swelling is noted around the first metatarsophalangeal joint bilateral. Assessment & Plan:   Assessment: Sesamoiditis cannot rule out a fracture to the  tibial sesamoid on the right side.  Otherwise bursitis first metatarsal phalangeal joint plantarly.  Cavus foot deformity bilateral.  Plan: We discussed the etiology pathology conservative versus surgical therapies at this point I would like to have her to be seen by Lolita for orthotics to help offload the sesamoids of that first metatarsophalangeal joint  bilaterally.     Roe Wilner T. [place], [place]

## 2024-01-16 ENCOUNTER — Encounter: Payer: Self-pay | Admitting: Family Medicine

## 2024-01-31 ENCOUNTER — Encounter: Payer: Self-pay | Admitting: Internal Medicine

## 2024-01-31 ENCOUNTER — Ambulatory Visit: Payer: BC Managed Care – PPO | Admitting: Internal Medicine

## 2024-01-31 VITALS — BP 92/70 | HR 92 | Temp 98.4°F | Ht 63.75 in | Wt 156.0 lb

## 2024-01-31 DIAGNOSIS — H60501 Unspecified acute noninfective otitis externa, right ear: Secondary | ICD-10-CM

## 2024-01-31 DIAGNOSIS — H6090 Unspecified otitis externa, unspecified ear: Secondary | ICD-10-CM | POA: Insufficient documentation

## 2024-01-31 MED ORDER — NEOMYCIN-POLYMYXIN-HC 1 % OT SOLN: 3.0000 [drp] | Freq: Four times a day (QID) | OTIC | 1 refills | Status: DC

## 2024-01-31 MED ORDER — PREDNISONE 20 MG PO TABS: 40.0000 mg | ORAL_TABLET | Freq: Every day | ORAL | 0 refills | Status: DC

## 2024-01-31 NOTE — Assessment & Plan Note (Signed)
 Not really inflamed--but will give cortisporin to see if it helps symptoms Can use OTC hydrocortisone 1% topically on [facility] Rx for prednisone--in case middle ear issues after flying

## 2024-01-31 NOTE — Progress Notes (Signed)
 Subjective:    Patient ID: Beth Nielsen, female    DOB: 13-Jul-1961, 63 y.o.   MRN: [id number]  HPI Here due to right ear pain  Several years ago--had ear itching Got some cream from Dr Milinda Antis then Now the itching started again in December Slight on left side Now achy for a month and worse in the past few days No ear discharge  Travelling to [place] next week--concerned about this No trouble in general with ear travel Was overseas in [place] trouble then  Current Outpatient Medications on File Prior to Visit  Medication Sig Dispense Refill   CALCIUM-VITAMIN D PO Take 1 capsule by mouth daily.     estradiol (VIVELLE-DOT) 0.05 MG/24HR patch Place 1 patch onto the skin 2 (two) times a week.     rosuvastatin (CRESTOR) 5 MG tablet Take 1 tablet (5 mg total) by mouth daily. 90 tablet 2   venlafaxine XR (EFFEXOR XR) 37.5 MG 24 hr capsule Take 1 capsule (37.5 mg total) by mouth daily with breakfast. 90 capsule 1   No current facility-administered medications on file prior to visit.    No Known Allergies  Past Medical History:  Diagnosis Date   Anxiety 2011   Mild and under control/med management   History of chickenpox    History of UTI    Hyperlipidemia Aug. 2022   Med management-   Mild anemia 08/08/2023    Past Surgical History:  Procedure Laterality Date   ABDOMINAL HYSTERECTOMY  09/20/17   BLADDER SURGERY  2018   removal of benign growth   ECTOPIC PREGNANCY SURGERY  2003   HYSTEROTOMY  2018   TONSILLECTOMY  1972   WISDOM TOOTH EXTRACTION     age 76    Family History  Problem Relation Age of Onset   Alzheimer's disease Mother    Glaucoma Mother    Blindness Mother    Miscarriages / [place] Mother    Varicose Veins Mother    Heart failure Father    Hyperlipidemia Father    COPD Brother    Depression Brother    Hyperlipidemia Brother    Anxiety disorder Brother    Arthritis Paternal Grandmother    Alcohol abuse Paternal Uncle    Breast cancer Neg  Hx     Social History   Socioeconomic History   Marital status: Married    Spouse name: Not on file   Number of children: Not on file   Years of education: Not on file   Highest education level: Master's degree (e.g., MA, MS, MEng, MEd, MSW, MBA)  Occupational History   Not on file  Tobacco Use   Smoking status: Never   Smokeless tobacco: Never  Substance and Sexual Activity   Alcohol use: Yes    Alcohol/week: 9.0 standard drinks of alcohol    Types: 7 Glasses of wine, 2 Standard drinks or equivalent per week    Comment: Wine in evening- occasional cocktails   Drug use: Never   Sexual activity: Yes    Birth control/protection: Post-menopausal  Other Topics Concern   Not on file  Social History Narrative   Not on file   Social Drivers of Health   Financial Resource Strain: Low Risk  (01/30/2024)   Overall Financial Resource Strain (CARDIA)    Difficulty of Paying Living Expenses: Not hard at all  Food Insecurity: No Food Insecurity (01/30/2024)   Hunger Vital Sign    Worried About Running Out of Food in the Last Year: Never  true    Ran Out of Food in the Last Year: Never true  Transportation Needs: No Transportation Needs (01/30/2024)   PRAPARE - Administrator, Civil Service (Medical): No    Lack of Transportation (Non-Medical): No  Physical Activity: Sufficiently Active (01/30/2024)   Exercise Vital Sign    Days of Exercise per Week: 7 days    Minutes of Exercise per Session: 40 min  Stress: No Stress Concern Present (01/30/2024)   [employer] of Occupational Health - Occupational Stress Questionnaire    Feeling of Stress : Not at all  Social Connections: Socially Integrated (01/30/2024)   Social Connection and Isolation Panel [NHANES]    Frequency of Communication with Friends and Family: More than three times a week    Frequency of Social Gatherings with Friends and Family: Three times a week    Attends Religious Services: More than 4 times per year     Active Member of Clubs or Organizations: Yes    Attends Engineer, structural: More than 4 times per year    Marital Status: Married  Catering manager Violence: Not on file   Review of Systems No dandruff No hearing loss No tinnitus No fever/URI symptoms    Objective:   Physical Exam Constitutional:      Appearance: Normal appearance.  HENT:     Left Ear: Tympanic membrane and ear canal normal.     Ears:     Comments:  No tragal tenderness Mild swelling in right canal but no inflammation. TM may be slightly retracted Neurological:     Mental Status: She is alert.            Assessment & Plan:

## 2024-02-06 ENCOUNTER — Ambulatory Visit: Payer: BC Managed Care – PPO | Admitting: Family Medicine

## 2024-02-20 ENCOUNTER — Ambulatory Visit: Admitting: Family Medicine

## 2024-02-20 ENCOUNTER — Encounter: Payer: Self-pay | Admitting: Family Medicine

## 2024-02-20 VITALS — BP 136/86 | HR 71 | Temp 97.8°F | Ht 63.75 in | Wt 156.4 lb

## 2024-02-20 DIAGNOSIS — H9201 Otalgia, right ear: Secondary | ICD-10-CM | POA: Diagnosis not present

## 2024-02-20 DIAGNOSIS — J301 Allergic rhinitis due to pollen: Secondary | ICD-10-CM | POA: Diagnosis not present

## 2024-02-20 DIAGNOSIS — M25561 Pain in right knee: Secondary | ICD-10-CM | POA: Insufficient documentation

## 2024-02-20 NOTE — Assessment & Plan Note (Signed)
 Injured tripping up stairs  Bothersome since then /primarily medial pain  Did improve after a fleeting sharp pain behind knee - may have burst a bakers cyst  Now improved  Discussed knee injury and what to avoid -see AVS Do low impact activies Ice  Elevation  Knee compression sleeve as needed  Voltaren gel over the counter or oral ibuprofen    If no further improvement recommend follow up with sport med and possibly imaging

## 2024-02-20 NOTE — Progress Notes (Signed)
 Subjective:    Patient ID: Beth Nielsen, female    DOB: 07-Jun-1961, 63 y.o.   MRN: [id number]  HPI  Wt Readings from Last 3 Encounters:  02/20/24 156 lb 6 oz (70.9 kg)  01/31/24 156 lb (70.8 kg)  11/14/23 154 lb 8 oz (70.1 kg)   27.05 kg/m  Vitals:   02/20/24 1420  BP: 136/86  Pulse: 71  Temp: 97.8 F (36.6 C)  SpO2: 98%    Pt presents for c/o  Right knee injury  Right ear problem   Walked a lot when traveling  Had a fall 2 wk ago  Tripped going up stairs - caught herself  Within 3 days noted pain to bend at 90 deg  Tight  Felt unstable  Then had sharp pain behind knee when getting up - after that much improved   Triggered getting in and out of car  Not visibly swollen   More uncomfortable medially  Other knee bothers a bit occasionally  Less tender than it was   Took advil -it helped some  No ice or heat   Exercise  Walks dog abot 1 h per day  2 classes of light cardio and weights  Does not feel as strong as she used to  Wants to start biking      Right ear pain -since December  Seen by Dr Alphonsus Sias last month  Prescription prednisone 40 mg for ear pain with flying  (out of country 2 wk) -did not end up taking  Also neomycin/polymyxin hc drops four times daily  Improved and then worsened again   Occational daily antihistamine  No nasal sprays     Patient Active Problem List   Diagnosis Date Noted   Right knee pain 02/20/2024   Right ear pain 02/20/2024   Otitis externa 01/31/2024   Insomnia 11/14/2023   Foot pain, right 10/15/2023   Right shoulder pain 10/15/2023   Plant dermatitis 09/04/2023   Mild anemia 08/08/2023   Colon cancer screening 12/29/2022   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/18/2021   Callus of foot 07/18/2021   Allergic rhinitis 04/06/2021   Hyperlipidemia 07/12/2020   Urinary urgency 10/28/2017   Uterine fibroid 10/28/2017   Past Medical History:  Diagnosis Date   Anxiety 2011   Mild and under control/med  management   History of chickenpox    History of UTI    Hyperlipidemia Aug. 2022   Med management-   Mild anemia 08/08/2023   Past Surgical History:  Procedure Laterality Date   ABDOMINAL HYSTERECTOMY  09/20/17   BLADDER SURGERY  2018   removal of benign growth   ECTOPIC PREGNANCY SURGERY  2003   HYSTEROTOMY  2018   TONSILLECTOMY  1972   WISDOM TOOTH EXTRACTION     age 62   Social History   Tobacco Use   Smoking status: Never   Smokeless tobacco: Never  Substance Use Topics   Alcohol use: Yes    Alcohol/week: 9.0 standard drinks of alcohol    Types: 7 Glasses of wine, 2 Standard drinks or equivalent per week    Comment: Wine in evening- occasional cocktails   Drug use: Never   Family History  Problem Relation Age of Onset   Alzheimer's disease Mother    Glaucoma Mother    Blindness Mother    Miscarriages / [place] Mother    Varicose Veins Mother    Heart failure Father    Hyperlipidemia Father    COPD Brother  Depression Brother    Hyperlipidemia Brother    Anxiety disorder Brother    Arthritis Paternal Grandmother    Alcohol abuse Paternal Uncle    Breast cancer Neg Hx    No Known Allergies Current Outpatient Medications on File Prior to Visit  Medication Sig Dispense Refill   CALCIUM-VITAMIN D PO Take 1 capsule by mouth daily.     estradiol (VIVELLE-DOT) 0.05 MG/24HR patch Place 1 patch onto the skin 2 (two) times a week.     rosuvastatin (CRESTOR) 5 MG tablet Take 1 tablet (5 mg total) by mouth daily. 90 tablet 2   venlafaxine XR (EFFEXOR XR) 37.5 MG 24 hr capsule Take 1 capsule (37.5 mg total) by mouth daily with breakfast. 90 capsule 1   No current facility-administered medications on file prior to visit.    Review of Systems  Constitutional:  Negative for activity change, appetite change, fatigue, fever and unexpected weight change.  HENT:  Positive for ear discharge, ear pain and postnasal drip. Negative for congestion, facial swelling,  rhinorrhea, sinus pressure and sore throat.   Eyes:  Negative for pain, redness and visual disturbance.  Respiratory:  Negative for cough, shortness of breath and wheezing.   Cardiovascular:  Negative for chest pain and palpitations.  Gastrointestinal:  Negative for abdominal pain, blood in stool, constipation and diarrhea.  Endocrine: Negative for polydipsia and polyuria.  Genitourinary:  Negative for dysuria, frequency and urgency.  Musculoskeletal:  Positive for arthralgias. Negative for back pain and myalgias.  Skin:  Negative for pallor and rash.  Allergic/Immunologic: Negative for environmental allergies.  Neurological:  Negative for dizziness, syncope and headaches.  Hematological:  Negative for adenopathy. Does not bruise/bleed easily.  Psychiatric/Behavioral:  Negative for decreased concentration and dysphoric mood. The patient is not nervous/anxious.        Objective:   Physical Exam Constitutional:      General: She is not in acute distress.    Appearance: Normal appearance. She is normal weight. She is not ill-appearing or diaphoretic.  HENT:     Right Ear: Ear canal normal.     Left Ear: Tympanic membrane and ear canal normal.     Ears:     Comments: Right TM is more dull appearing than the left  No effusion or bulging or erythema  Canals are clear     Nose:     Comments: Nares are boggy    Mouth/Throat:     Mouth: Mucous membranes are moist.     Pharynx: No oropharyngeal exudate or posterior oropharyngeal erythema.  Eyes:     General:        Right eye: No discharge.        Left eye: No discharge.     Conjunctiva/sclera: Conjunctivae normal.     Pupils: Pupils are equal, round, and reactive to light.  Cardiovascular:     Rate and Rhythm: Normal rate and regular rhythm.     Heart sounds: Normal heart sounds.  Pulmonary:     Effort: Pulmonary effort is normal. No respiratory distress.     Breath sounds: No stridor. No wheezing, rhonchi or rales.   Musculoskeletal:     Cervical back: Normal range of motion and neck supple.     Comments: Knee (right)  No swelling or effusion  No warmth to the touch  No crepitus  ROM:  Flex-pain at full flexion  Ext full Mcmurray -some medial discomfort  Bounce test -normal    Stability: Anterior drawer-normal  Lachman  exam -normal   Tenderness -mild over medial joint line  No posterior tenderness  Gait -normal   No LE edema/ palp cord or tenderness      Lymphadenopathy:     Cervical: No cervical adenopathy.  Skin:    Coloration: Skin is not jaundiced or pale.     Findings: No bruising or erythema.  Neurological:     Mental Status: She is alert.     Sensory: No sensory deficit.     Motor: No weakness.     Coordination: Coordination normal.     Deep Tendon Reflexes: Reflexes normal.  Psychiatric:        Mood and Affect: Mood normal.           Assessment & Plan:   Problem List Items Addressed This Visit       Respiratory   Allergic rhinitis   Worse recently /likely due to tree pollen  Also just traveled   No doubt adding to ear c/o (ETD suspected)  Will get back on flonase  Over the counter antihistamine of choice  Instructed to call if not improved         Other   Right knee pain - Primary   Injured tripping up stairs  Bothersome since then /primarily medial pain  Did improve after a fleeting sharp pain behind knee - may have burst a bakers cyst  Now improved  Discussed knee injury and what to avoid -see AVS Do low impact activies Ice  Elevation  Knee compression sleeve as needed  Voltaren gel over the counter or oral ibuprofen    If no further improvement recommend follow up with sport med and possibly imaging        Right ear pain   Canal looks clear  Suspect ETD (just traveled by plane)   Finished treatment for OE  Reviewed note/plan from Dr Alphonsus Sias   Advised to start back on flonase- see AVS  Update if not starting to improve in a week  or if worsening  Call back and Er precautions noted in detail today    Consider ENT referral if not resolved in several weeks

## 2024-02-20 NOTE — Assessment & Plan Note (Addendum)
 Worse recently /likely due to tree pollen  Also just traveled   No doubt adding to ear c/o (ETD suspected)  Will get back on flonase  Over the counter antihistamine of choice  Instructed to call if not improved

## 2024-02-20 NOTE — Assessment & Plan Note (Addendum)
 Canal looks clear  Suspect ETD (just traveled by plane)   Finished treatment for OE  Reviewed note/plan from Dr Alphonsus Sias   Advised to start back on flonase- see AVS  Update if not starting to improve in a week or if worsening  Call back and Er precautions noted in detail today    Consider ENT referral if not resolved in several weeks

## 2024-02-20 NOTE — Patient Instructions (Signed)
 Use ice on knee for 10 minutes whenever you can   Try voltaren gel over the counter 1%  up to four times daily (or oral ibuprofen)   Pedaling is better than walking for now   If no further improvement / follow up with Dr Patsy Lager   Avoid squats, lunges , running    For the ear  Use flonase nasal spray twice daily for 3 days then once daily through the allergy season   The ear canal looks fine     For weight loss Try to get most of your carbohydrates from produce (with the exception of white potatoes) and whole grains Eat less bread/pasta/rice/snack foods/cereals/sweets and other items from the middle of the grocery store (processed carbs)  Lean protein is best   Keep strength training also !   NOOm and weight watchers are great on line programs

## 2024-02-27 ENCOUNTER — Telehealth: Payer: Self-pay

## 2024-02-27 NOTE — Telephone Encounter (Signed)
 Left VM to reschedule her 4/4 appt tricia will be out of office.

## 2024-03-03 ENCOUNTER — Encounter: Payer: Self-pay | Admitting: Family Medicine

## 2024-03-03 ENCOUNTER — Telehealth (INDEPENDENT_AMBULATORY_CARE_PROVIDER_SITE_OTHER): Admitting: Family Medicine

## 2024-03-03 ENCOUNTER — Ambulatory Visit: Admitting: Family Medicine

## 2024-03-03 DIAGNOSIS — J069 Acute upper respiratory infection, unspecified: Secondary | ICD-10-CM | POA: Diagnosis not present

## 2024-03-03 NOTE — Patient Instructions (Addendum)
 Drink fluids and rest  mucinex DM is good for cough and congestion if needed  Nasal saline for congestion as needed  Tylenol and /or ibuprofen for fever or pain or headache  Continue flonase  Please alert [place] if symptoms worsen (if severe or short of breath please go to the ER)    Update if not starting to improve in a week or if worsening    Return to work when temp is down and you feel better  Wear a mask until cough improves

## 2024-03-03 NOTE — Assessment & Plan Note (Signed)
 Suspect influenza given abrupt onset and fever and time line  Neg covid test  Too late for anti viral (over 48 hours) Discussed symptom control /fever control If cough worsens- mucinex dm may be helpful  Tylenol/ibuprofen prn fever  Return to work when better/note done  Disc symptomatic care - see instructions on AVS  Call back and Er precautions noted in detail today

## 2024-03-03 NOTE — Progress Notes (Signed)
 Virtual Visit via Video Note  I connected with Beth Nielsen on 03/03/24 at  9:00 AM EDT by a video enabled telemedicine application and verified that I am speaking with the correct person using two identifiers.  Patient Location: Home Provider Location: Office/Clinic  I discussed the limitations, risks, security, and privacy concerns of performing an evaluation and management service by video and the availability of in person appointments. I also discussed with the patient that there may be a patient responsible charge related to this service. The patient expressed understanding and agreed to proceed.  Parties involved in encounter  Patient: Beth Nielsen  Provider:  Roxy Manns MD   Subjective: PCP: Judy Pimple, MD  Chief Complaint  Patient presents with   Nasal Congestion   Cough   Fever   Headache   Generalized Body Aches   Fatigue   HPI Pt presents with uri symptoms and fever  Works in a school   Symptoms started very late fri night Fever and body aches  Did have chills  Soaked the bed this am  Stuffy nose  Headache -mostly frontal but whole head hurts  Sleepy  Has not been out of bed  Cough- just started / dry cough  No wheezing  No shortness of breath  Scratchy throat / a little sore  Pnd  One ear feels plugged- right ear   Temp last night of 101  Highest   Did covid test-negative    Did have flu shot in sept along with covid shot   Over the counter Sudafed Advil  Flonase ns  Drinking lots of fluids    ROS: Per HPI Review of Systems  Constitutional:  Positive for chills, diaphoresis, fever and malaise/fatigue.  HENT:  Positive for congestion and sore throat. Negative for ear pain and sinus pain.   Eyes:  Negative for blurred vision, discharge and redness.  Respiratory:  Positive for cough. Negative for sputum production, shortness of breath, wheezing and stridor.   Cardiovascular:  Negative for chest pain, palpitations and leg swelling.   Gastrointestinal:  Negative for abdominal pain, diarrhea, nausea and vomiting.  Musculoskeletal:  Negative for myalgias.  Skin:  Negative for rash.  Neurological:  Positive for headaches. Negative for dizziness.     Current Outpatient Medications:    CALCIUM-VITAMIN D PO, Take 1 capsule by mouth daily., Disp: , Rfl:    estradiol (VIVELLE-DOT) 0.05 MG/24HR patch, Place 1 patch onto the skin 2 (two) times a week., Disp: , Rfl:    rosuvastatin (CRESTOR) 5 MG tablet, Take 1 tablet (5 mg total) by mouth daily., Disp: 90 tablet, Rfl: 2   venlafaxine XR (EFFEXOR XR) 37.5 MG 24 hr capsule, Take 1 capsule (37.5 mg total) by mouth daily with breakfast., Disp: 90 capsule, Rfl: 1  Observations/Objective: There were no vitals filed for this visit. Physical Exam Patient appears well, in no distress/ but is fatigued  Weight is baseline  No facial swelling or asymmetry Normal voice-not hoarse and no slurred speech, does sound nasally congested  No obvious tremor or mobility impairment Moving neck and UEs normally Able to hear the call well  No cough or shortness of breath during interview / no audible wheezing Does clear throat occasionally Talkative and mentally sharp with no cognitive changes No skin changes on face or neck , no rash or pallor Affect is normal   Assessment and Plan: There are no diagnoses linked to this encounter. Problem List Items Addressed This Visit  Respiratory   URI with cough and congestion - Primary   Suspect influenza given abrupt onset and fever and time line  Neg covid test  Too late for anti viral (over 48 hours) Discussed symptom control /fever control If cough worsens- mucinex dm may be helpful  Tylenol/ibuprofen prn fever  Return to work when better/note done  Disc symptomatic care - see instructions on AVS  Call back and Er precautions noted in detail today           Follow Up Instructions: No follow-ups on file.  Drink fluids and rest   mucinex DM is good for cough and congestion if needed  Nasal saline for congestion as needed  Tylenol and /or ibuprofen for fever or pain or headache  Continue flonase  Please alert [place] if symptoms worsen (if severe or short of breath please go to the ER)    Update if not starting to improve in a week or if worsening    Return to work when temp is down and you feel better  Wear a mask until cough improves     I discussed the assessment and treatment plan with the patient. The patient was provided an opportunity to ask questions, and all were answered. The patient agreed with the plan and demonstrated an understanding of the instructions.   The patient was advised to call back or seek an in-person evaluation if the symptoms worsen or if the condition fails to improve as anticipated.  The above assessment and management plan was discussed with the patient. The patient verbalized understanding of and has agreed to the management plan.   Roxy Manns, MD

## 2024-03-07 ENCOUNTER — Other Ambulatory Visit: Payer: BC Managed Care – PPO

## 2024-03-09 NOTE — Progress Notes (Unsigned)
     Betti Goodenow T. Yang Rack, MD, CAQ Sports Medicine [facility] [address] [place], [address]  Phone: [phone]  FAX: [phone]  Darneshia Demary - 63 y.o. female  MRN [id number]  Date of Birth: 17-Aug-1961  Date: 03/10/2024  PCP: Judy Pimple, MD  Referral: Judy Pimple, MD  No chief complaint on file.  Subjective:   Mackenzi Krogh is a 63 y.o. very pleasant female patient with There is no height or weight on file to calculate BMI. who presents with the following:  The patient presents with some ongoing right-sided knee pain.    Review of Systems is noted in the HPI, as appropriate  Objective:   There were no vitals taken for this visit.  GEN: No acute distress; alert,appropriate. PULM: Breathing comfortably in no respiratory distress PSYCH: Normally interactive.   Laboratory and Imaging Data:  Assessment and Plan:   ***

## 2024-03-10 ENCOUNTER — Ambulatory Visit: Admitting: Family Medicine

## 2024-03-10 ENCOUNTER — Ambulatory Visit (INDEPENDENT_AMBULATORY_CARE_PROVIDER_SITE_OTHER)
Admission: RE | Admit: 2024-03-10 | Discharge: 2024-03-10 | Disposition: A | Discharge status: Home / Self Care | Source: Ambulatory Visit | Attending: Family Medicine | Admitting: Family Medicine

## 2024-03-10 ENCOUNTER — Encounter: Payer: Self-pay | Admitting: Family Medicine

## 2024-03-10 VITALS — BP 120/68 | HR 70 | Temp 97.8°F | Ht 63.75 in | Wt 151.2 lb

## 2024-03-10 DIAGNOSIS — M25561 Pain in right knee: Secondary | ICD-10-CM

## 2024-03-10 DIAGNOSIS — M25461 Effusion, right knee: Secondary | ICD-10-CM | POA: Diagnosis not present

## 2024-03-10 DIAGNOSIS — M11261 Other chondrocalcinosis, right knee: Secondary | ICD-10-CM | POA: Diagnosis not present

## 2024-03-10 DIAGNOSIS — M1711 Unilateral primary osteoarthritis, right knee: Secondary | ICD-10-CM | POA: Diagnosis not present

## 2024-03-25 ENCOUNTER — Telehealth: Admitting: Family Medicine

## 2024-03-28 ENCOUNTER — Telehealth: Payer: Self-pay | Admitting: *Deleted

## 2024-03-28 DIAGNOSIS — H9201 Otalgia, right ear: Secondary | ICD-10-CM

## 2024-03-28 NOTE — Telephone Encounter (Signed)
 Copied from CRM [id number]. Topic: Referral - Question >> Mar 28, 2024  3:45 PM Ovid Blow wrote: Reason for CRM: Patient has been having issues with right ear and wanted Dr. Malissa Se to refer her to an ENT. Patient is taking Flonase  and has been doing everything that has been suggested so far. Ear is itching and sore

## 2024-03-28 NOTE — Telephone Encounter (Signed)
 I put the referral in  Please let us know if you don't hear in 1-2 weeks

## 2024-03-31 ENCOUNTER — Encounter: Payer: Self-pay | Admitting: Family Medicine

## 2024-03-31 DIAGNOSIS — M11261 Other chondrocalcinosis, right knee: Secondary | ICD-10-CM

## 2024-04-01 ENCOUNTER — Other Ambulatory Visit (INDEPENDENT_AMBULATORY_CARE_PROVIDER_SITE_OTHER)

## 2024-04-01 DIAGNOSIS — M11261 Other chondrocalcinosis, right knee: Secondary | ICD-10-CM

## 2024-04-02 ENCOUNTER — Encounter: Payer: Self-pay | Admitting: Family Medicine

## 2024-04-02 LAB — MAGNESIUM: Magnesium: 1.9 mg/dL (ref 1.5–2.5)

## 2024-04-02 LAB — PTH, INTACT AND CALCIUM
Calcium: 9.6 mg/dL (ref 8.6–10.4)
PTH: 31 pg/mL (ref 16–77)

## 2024-04-02 LAB — IBC + FERRITIN
Ferritin: 44 ng/mL (ref 10.0–291.0)
Iron: 96 ug/dL (ref 42–145)
Saturation Ratios: 34.5 % (ref 20.0–50.0)
TIBC: 278.6 ug/dL (ref 250.0–450.0)
Transferrin: 199 mg/dL — ABNORMAL LOW (ref 212.0–360.0)

## 2024-04-02 LAB — T3, FREE: T3, Free: 2.9 pg/mL (ref 2.3–4.2)

## 2024-04-02 LAB — TSH: TSH: 1.09 u[IU]/mL (ref 0.35–5.50)

## 2024-04-02 LAB — T4, FREE: Free T4: 0.71 ng/dL (ref 0.60–1.60)

## 2024-04-30 ENCOUNTER — Encounter: Payer: Self-pay | Admitting: Family Medicine

## 2024-04-30 ENCOUNTER — Telehealth: Payer: Self-pay | Admitting: *Deleted

## 2024-04-30 NOTE — Telephone Encounter (Signed)
 Sent mychart letting pt know  ?

## 2024-04-30 NOTE — Telephone Encounter (Signed)
 Pt sent a message saying:  Good morning Dr Malissa Se, I am wondering if I can double my dose of venlafaxine - to take 2, 37.5 capsules. I'm just not sleeping well. I'm curious if there are some other things I need to do prior to possibly increasing the dose.

## 2024-04-30 NOTE — Telephone Encounter (Signed)
 Yes, that is fine - go ahead and double up, I think she has taken 75 mg in past   Please schedule follow up when I get back to discuss mood and sleep  If needed- can sent 75 mg effexor  xr to her pharmacy [id number]   I am currently out of the office/out of town

## 2024-05-06 ENCOUNTER — Encounter: Payer: Self-pay | Admitting: Family Medicine

## 2024-05-06 DIAGNOSIS — H9201 Otalgia, right ear: Secondary | ICD-10-CM

## 2024-05-14 ENCOUNTER — Ambulatory Visit: Admitting: Family Medicine

## 2024-05-16 ENCOUNTER — Other Ambulatory Visit: Payer: Self-pay | Admitting: Family Medicine

## 2024-05-19 NOTE — Telephone Encounter (Signed)
 The duplicate referral was cancelled.  The original referral placed on 04/25 is valid for one year and refaxed.  MyChart message was also resent to patient.

## 2024-06-24 ENCOUNTER — Other Ambulatory Visit: Payer: Self-pay | Admitting: Family Medicine

## 2024-06-24 DIAGNOSIS — Z1231 Encounter for screening mammogram for malignant neoplasm of breast: Secondary | ICD-10-CM

## 2024-06-26 DIAGNOSIS — J301 Allergic rhinitis due to pollen: Secondary | ICD-10-CM | POA: Diagnosis not present

## 2024-06-26 DIAGNOSIS — H6061 Unspecified chronic otitis externa, right ear: Secondary | ICD-10-CM | POA: Diagnosis not present

## 2024-06-26 DIAGNOSIS — H6981 Other specified disorders of Eustachian tube, right ear: Secondary | ICD-10-CM | POA: Diagnosis not present

## 2024-06-26 DIAGNOSIS — M26641 Arthritis of right temporomandibular joint: Secondary | ICD-10-CM | POA: Diagnosis not present

## 2024-07-01 DIAGNOSIS — Z79890 Hormone replacement therapy (postmenopausal): Secondary | ICD-10-CM | POA: Diagnosis not present

## 2024-07-11 ENCOUNTER — Ambulatory Visit (INDEPENDENT_AMBULATORY_CARE_PROVIDER_SITE_OTHER)

## 2024-07-11 DIAGNOSIS — M7751 Other enthesopathy of right foot: Secondary | ICD-10-CM

## 2024-07-11 DIAGNOSIS — Q6672 Congenital pes cavus, left foot: Secondary | ICD-10-CM

## 2024-07-11 DIAGNOSIS — Q6671 Congenital pes cavus, right foot: Secondary | ICD-10-CM | POA: Diagnosis not present

## 2024-07-11 DIAGNOSIS — M7752 Other enthesopathy of left foot: Secondary | ICD-10-CM

## 2024-07-18 NOTE — Progress Notes (Signed)
 Patient presents today to pick up custom molded foot orthotics, diagnosed with Bursitis by Dr. Verta.   Powersteps given to start patient will return for Custom if she feels she needs  It   Lolita Schultze Cped, CFo, CFm

## 2024-07-29 ENCOUNTER — Other Ambulatory Visit (INDEPENDENT_AMBULATORY_CARE_PROVIDER_SITE_OTHER)

## 2024-07-29 ENCOUNTER — Ambulatory Visit: Payer: Self-pay | Admitting: Family Medicine

## 2024-07-29 DIAGNOSIS — Z0000 Encounter for general adult medical examination without abnormal findings: Secondary | ICD-10-CM | POA: Diagnosis not present

## 2024-07-29 DIAGNOSIS — E7800 Pure hypercholesterolemia, unspecified: Secondary | ICD-10-CM

## 2024-07-29 LAB — COMPREHENSIVE METABOLIC PANEL WITH GFR
ALT: 14 U/L (ref 0–35)
AST: 21 U/L (ref 0–37)
Albumin: 4.3 g/dL (ref 3.5–5.2)
Alkaline Phosphatase: 50 U/L (ref 39–117)
BUN: 10 mg/dL (ref 6–23)
CO2: 29 meq/L (ref 19–32)
Calcium: 9.3 mg/dL (ref 8.4–10.5)
Chloride: 101 meq/L (ref 96–112)
Creatinine, Ser: 0.74 mg/dL (ref 0.40–1.20)
GFR: 87.39 mL/min (ref >60.00)
Glucose, Bld: 87 mg/dL (ref 70–99)
Potassium: 4.2 meq/L (ref 3.5–5.1)
Sodium: 140 meq/L (ref 135–145)
Total Bilirubin: 0.7 mg/dL (ref 0.2–1.2)
Total Protein: 7 g/dL (ref 6.0–8.3)

## 2024-07-29 LAB — CBC WITH DIFFERENTIAL/PLATELET
Basophils Absolute: 0 K/uL (ref 0.0–0.1)
Basophils Relative: 0.3 % (ref 0.0–3.0)
Eosinophils Absolute: 0.2 K/uL (ref 0.0–0.7)
Eosinophils Relative: 3.4 % (ref 0.0–5.0)
HCT: 38.8 % (ref 36.0–46.0)
Hemoglobin: 13.1 g/dL (ref 12.0–15.0)
Lymphocytes Relative: 33.4 % (ref 12.0–46.0)
Lymphs Abs: 1.6 K/uL (ref 0.7–4.0)
MCHC: 33.8 g/dL (ref 30.0–36.0)
MCV: 91 fl (ref 78.0–100.0)
Monocytes Absolute: 0.4 K/uL (ref 0.1–1.0)
Monocytes Relative: 7.8 % (ref 3.0–12.0)
Neutro Abs: 2.7 K/uL (ref 1.4–7.7)
Neutrophils Relative %: 55.1 % (ref 43.0–77.0)
Platelets: 213 K/uL (ref 150.0–400.0)
RBC: 4.27 Mil/uL (ref 3.87–5.11)
RDW: 12.8 % (ref 11.5–15.5)
WBC: 4.9 K/uL (ref 4.0–10.5)

## 2024-07-29 LAB — TSH: TSH: 2.23 u[IU]/mL (ref 0.35–5.50)

## 2024-07-29 LAB — LIPID PANEL
Cholesterol: 189 mg/dL (ref 0–200)
HDL: 72.1 mg/dL (ref >39.00)
LDL Cholesterol: 102 mg/dL — ABNORMAL HIGH (ref 0–99)
NonHDL: 117.02
Total CHOL/HDL Ratio: 3
Triglycerides: 75 mg/dL (ref 0.0–149.0)
VLDL: 15 mg/dL (ref 0.0–40.0)

## 2024-08-07 ENCOUNTER — Ambulatory Visit
Admission: RE | Admit: 2024-08-07 | Discharge: 2024-08-07 | Disposition: A | Discharge status: Home / Self Care | Source: Ambulatory Visit | Attending: Family Medicine | Admitting: Family Medicine

## 2024-08-07 DIAGNOSIS — Z1231 Encounter for screening mammogram for malignant neoplasm of breast: Secondary | ICD-10-CM | POA: Diagnosis not present

## 2024-08-08 ENCOUNTER — Encounter: Payer: Self-pay | Admitting: Family Medicine

## 2024-08-08 ENCOUNTER — Ambulatory Visit: Admitting: Family Medicine

## 2024-08-08 VITALS — BP 118/62 | HR 68 | Temp 98.3°F | Ht 64.0 in | Wt 147.5 lb

## 2024-08-08 DIAGNOSIS — Z1211 Encounter for screening for malignant neoplasm of colon: Secondary | ICD-10-CM | POA: Diagnosis not present

## 2024-08-08 DIAGNOSIS — E7800 Pure hypercholesterolemia, unspecified: Secondary | ICD-10-CM | POA: Diagnosis not present

## 2024-08-08 DIAGNOSIS — D649 Anemia, unspecified: Secondary | ICD-10-CM

## 2024-08-08 DIAGNOSIS — F5104 Psychophysiologic insomnia: Secondary | ICD-10-CM

## 2024-08-08 DIAGNOSIS — Z0000 Encounter for general adult medical examination without abnormal findings: Secondary | ICD-10-CM

## 2024-08-08 DIAGNOSIS — Z23 Encounter for immunization: Secondary | ICD-10-CM | POA: Diagnosis not present

## 2024-08-08 DIAGNOSIS — Z79890 Hormone replacement therapy (postmenopausal): Secondary | ICD-10-CM

## 2024-08-08 DIAGNOSIS — I781 Nevus, non-neoplastic: Secondary | ICD-10-CM

## 2024-08-08 MED ORDER — ROSUVASTATIN CALCIUM 5 MG PO TABS: 5.0000 mg | ORAL_TABLET | Freq: Every day | ORAL | 3 refills | Status: AC

## 2024-08-08 MED ORDER — VENLAFAXINE HCL ER 37.5 MG PO CP24: 37.5000 mg | ORAL_CAPSULE | Freq: Every day | ORAL | 3 refills | Status: AC

## 2024-08-08 NOTE — Patient Instructions (Addendum)
 I put the referral in for vein clinic  Please let us  know if you don't hear in 1-2 weeks to set that up (mychart message or call or letter)   Support hose to the waist would be most helpful   Keep taking good care of yourself  Continue the exercise   Talk to your gyn about progesterone

## 2024-08-08 NOTE — Progress Notes (Signed)
 Subjective:    Patient ID: Beth Nielsen, female    DOB: Nov 07, 1961, 63 y.o.   MRN: [id number]  HPI  Here for health maintenance exam and to review chronic medical problems   Wt Readings from Last 3 Encounters:  08/08/24 147 lb 8 oz (66.9 kg)  03/10/24 151 lb 4 oz (68.6 kg)  02/20/24 156 lb 6 oz (70.9 kg)   25.32 kg/m  Vitals:   08/08/24 1534  BP: 118/62  Pulse: 68  Temp: 98.3 F (36.8 C)  SpO2: 97%    Immunization History  Administered Date(s) Administered   Influenza, Seasonal, Injecte, Preservative Fre 08/08/2023, 08/08/2024   Influenza,inj,Quad PF,6+ Mos 08/01/2019, 08/24/2020   Influenza-Unspecified 09/18/2022   PFIZER(Purple Top)SARS-COV-2 Vaccination 02/01/2020, 02/23/2020, 09/14/2020   PNEUMOCOCCAL CONJUGATE-20 08/04/2022   Pfizer Covid-19 Vaccine Bivalent Booster 109yrs & up 10/02/2021   Pfizer(Comirnaty)Fall Seasonal Vaccine 12 years and older 09/18/2022, 08/25/2023   Td 02/19/2018   Zoster Recombinant(Shingrix) 08/24/2020, 03/03/2021   Zoster, Live 04/09/2015    Health Maintenance Due  Topic Date Due   MAMMOGRAM  08/06/2024   Flu shot   Lost some weight with the NOOM program   Mammogram-yesterday / pending report  Self breast exam  Gyn health Hysterectomy  Does virtual visits with her gyn to refill hormones - vivelle  0.0375 mg twice weekly  Is interested in prometrium and will d/w gyn    Colon cancer screening  Colonoscopy 05/2023 with 5 y recall   Bone health    Falls-none  Fractures-none  Supplements -ca and D    Exercise  Yoga Pilates Walking  Weights     Mood    08/08/2024    3:39 PM 02/20/2024    2:33 PM 11/14/2023    2:38 PM 10/18/2023    2:32 PM 10/15/2023    3:08 PM  Depression screen PHQ 2/9  Decreased Interest 0 0 0 0 0  Down, Depressed, Hopeless 0 0 0 0 0  PHQ - 2 Score 0 0 0 0 0  Altered sleeping 1 0 3 0 0  Tired, decreased energy 0 0 3 0 0  Change in appetite 0 0 0 0 0  Feeling bad or failure about yourself   0 0 0 0 0  Trouble concentrating 0 0 0 0 0  Moving slowly or fidgety/restless 0 0 0 0 0  Suicidal thoughts 0 0 0 0 0  PHQ-9 Score 1 0 6 0 0  Difficult doing work/chores Not difficult at all Not difficult at all Somewhat difficult Not difficult at all Not difficult at all   Effexor  xr 37.5 mg daily -for insomnia    Hyperlipidemia Lab Results  Component Value Date   CHOL 189 07/29/2024   CHOL 192 07/30/2023   CHOL 188 07/31/2022   Lab Results  Component Value Date   HDL 72.10 07/29/2024   HDL 61.80 07/30/2023   HDL 62.10 07/31/2022   Lab Results  Component Value Date   LDLCALC 102 (H) 07/29/2024   LDLCALC 114 (H) 07/30/2023   LDLCALC 100 (H) 07/31/2022   Lab Results  Component Value Date   TRIG 75.0 07/29/2024   TRIG 80.0 07/30/2023   TRIG 130.0 07/31/2022   Lab Results  Component Value Date   CHOLHDL 3 07/29/2024   CHOLHDL 3 07/30/2023   CHOLHDL 3 07/31/2022   No results found for: LDLDIRECT Crestor  5 mg daily   The 10-year ASCVD risk score (Arnett DK, et al., 2019) is: 2.5%   Values used  to calculate the score:     Age: 26 years     Clincally relevant sex: Female     Is Non-Hispanic African American: No     Diabetic: No     Tobacco smoker: No     Systolic Blood Pressure: 118 mmHg     Is BP treated: No     HDL Cholesterol: 72.1 mg/dL     Total Cholesterol: 189 mg/dL  Father CAD at 71  Also smoker     Lab Results  Component Value Date   WBC 4.9 07/29/2024   HGB 13.1 07/29/2024   HCT 38.8 07/29/2024   MCV 91.0 07/29/2024   PLT 213.0 07/29/2024   Improved   Lab Results  Component Value Date   NA 140 07/29/2024   K 4.2 07/29/2024   CO2 29 07/29/2024   GLUCOSE 87 07/29/2024   BUN 10 07/29/2024   CREATININE 0.74 07/29/2024   CALCIUM  9.3 07/29/2024   GFR 87.39 07/29/2024   GFRNONAA >60 12/26/2022   Lab Results  Component Value Date   ALT 14 07/29/2024   AST 21 07/29/2024   ALKPHOS 50 07/29/2024   BILITOT 0.7 07/29/2024   Lab Results   Component Value Date   TSH 2.23 07/29/2024       Patient Active Problem List   Diagnosis Date Noted   Hormone replacement therapy (HRT) 08/09/2024   Spider veins 08/08/2024   Right knee pain 02/20/2024   Insomnia 11/14/2023   Right shoulder pain 10/15/2023   Mild anemia 08/08/2023   Colon cancer screening 12/29/2022   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/18/2021   Allergic rhinitis 04/06/2021   Hyperlipidemia 07/12/2020   Routine general medical examination at a health care facility 07/04/2020   Urinary urgency 10/28/2017   Uterine fibroid 10/28/2017   Past Medical History:  Diagnosis Date   Anxiety 2011   Mild and under control/med management   History of chickenpox    History of UTI    Hyperlipidemia Aug. 2022   Med management-   Mild anemia 08/08/2023   Past Surgical History:  Procedure Laterality Date   ABDOMINAL HYSTERECTOMY  09/20/17   BLADDER SURGERY  2018   removal of benign growth   ECTOPIC PREGNANCY SURGERY  2003   HYSTEROTOMY  2018   TONSILLECTOMY  1972   WISDOM TOOTH EXTRACTION     age 87   Social History   Tobacco Use   Smoking status: Never   Smokeless tobacco: Never  Substance Use Topics   Alcohol use: Yes    Alcohol/week: 8.0 standard drinks of alcohol    Types: 7 Glasses of wine, 1 Standard drinks or equivalent per week    Comment: Wine in evening- occasional cocktails   Drug use: Never   Family History  Problem Relation Age of Onset   Alzheimer's disease Mother    Glaucoma Mother    Blindness Mother    Miscarriages / [place] Mother    Varicose Veins Mother    Heart failure Father    Hyperlipidemia Father    COPD Brother    Depression Brother    Hyperlipidemia Brother    Anxiety disorder Brother    Arthritis Paternal Grandmother    Alcohol abuse Paternal Uncle    Breast cancer Neg Hx    No Known Allergies Current Outpatient Medications on File Prior to Visit  Medication Sig Dispense Refill   CALCIUM -VITAMIN D PO  Take 1 capsule by mouth daily.     estradiol  (  VIVELLE -DOT) 0.0375 MG/24HR Place 1 patch onto the skin 2 (two) times a week.     No current facility-administered medications on file prior to visit.    Review of Systems  Constitutional:  Negative for activity change, appetite change, fatigue, fever and unexpected weight change.  HENT:  Negative for congestion, ear pain, rhinorrhea, sinus pressure and sore throat.   Eyes:  Negative for pain, redness and visual disturbance.  Respiratory:  Negative for cough, shortness of breath and wheezing.   Cardiovascular:  Negative for chest pain and palpitations.  Gastrointestinal:  Negative for abdominal pain, blood in stool, constipation and diarrhea.  Endocrine: Negative for polydipsia and polyuria.  Genitourinary:  Negative for dysuria, frequency and urgency.  Musculoskeletal:  Negative for arthralgias, back pain and myalgias.  Skin:  Negative for pallor and rash.  Allergic/Immunologic: Negative for environmental allergies.  Neurological:  Negative for dizziness, syncope and headaches.  Hematological:  Negative for adenopathy. Does not bruise/bleed easily.  Psychiatric/Behavioral:  Positive for sleep disturbance. Negative for decreased concentration and dysphoric mood. The patient is not nervous/anxious.        Objective:   Physical Exam Constitutional:      General: She is not in acute distress.    Appearance: Normal appearance. She is well-developed and normal weight. She is not ill-appearing or diaphoretic.  HENT:     Head: Normocephalic and atraumatic.     Right Ear: Tympanic membrane, ear canal and external ear normal.     Left Ear: Tympanic membrane, ear canal and external ear normal.     Nose: Nose normal. No congestion.     Mouth/Throat:     Mouth: Mucous membranes are moist.     Pharynx: Oropharynx is clear. No posterior oropharyngeal erythema.  Eyes:     General: No scleral icterus.    Extraocular Movements: Extraocular movements  intact.     Conjunctiva/sclera: Conjunctivae normal.     Pupils: Pupils are equal, round, and reactive to light.  Neck:     Thyroid : No thyromegaly.     Vascular: No carotid bruit or JVD.  Cardiovascular:     Rate and Rhythm: Normal rate and regular rhythm.     Pulses: Normal pulses.     Heart sounds: Normal heart sounds.     No gallop.  Pulmonary:     Effort: Pulmonary effort is normal. No respiratory distress.     Breath sounds: Normal breath sounds. No wheezing.     Comments: Good air exch Chest:     Chest wall: No tenderness.  Abdominal:     General: Bowel sounds are normal. There is no distension or abdominal bruit.     Palpations: Abdomen is soft. There is no mass.     Tenderness: There is no abdominal tenderness.     Hernia: No hernia is present.  Genitourinary:    Comments: Breast exam: No mass, nodules, thickening, tenderness, bulging, retraction, inflamation, nipple discharge or skin changes noted.  No axillary or clavicular LA.     Musculoskeletal:        General: No tenderness. Normal range of motion.     Cervical back: Normal range of motion and neck supple. No rigidity. No muscular tenderness.     Right lower leg: No edema.     Left lower leg: No edema.     Comments: No kyphosis   Lymphadenopathy:     Cervical: No cervical adenopathy.  Skin:    General: Skin is warm and dry.  Coloration: Skin is not pale.     Findings: No erythema or rash.     Comments: Solar lentigines diffusely   Spider veins on upper and lower legs  No edema   Neurological:     Mental Status: She is alert. Mental status is at baseline.     Cranial Nerves: No cranial nerve deficit.     Motor: No abnormal muscle tone.     Coordination: Coordination normal.     Gait: Gait normal.     Deep Tendon Reflexes: Reflexes are normal and symmetric. Reflexes normal.  Psychiatric:        Mood and Affect: Mood normal.        Cognition and Memory: Cognition and memory normal.            Assessment & Plan:   Problem List Items Addressed This Visit       Cardiovascular and Mediastinum   Spider veins   Pt is interested in vein clinic referral To thigh  Some discomfort Recommend supp hose to waist to prevent worsening Referral done       Relevant Medications   rosuvastatin  (CRESTOR ) 5 MG tablet   Other Relevant Orders   Ambulatory referral to Vascular Surgery     Other   Routine general medical examination at a health care facility - Primary   Reviewed health habits including diet and exercise and skin cancer prevention Reviewed appropriate screening tests for age  Also reviewed health mt list, fam hx and immunization status , as well as social and family history   See HPI Labs reviewed and ordered Health Maintenance  Topic Date Due   Mammogram  08/06/2024   Hepatitis C Screening  06/29/2029*   HIV Screening  06/29/2029*   DTaP/Tdap/Td vaccine (2 - Tdap) 02/20/2028   Colon Cancer Screening  05/13/2028   Pneumococcal Vaccine for age over 62  Completed   Flu Shot  Completed   COVID-19 Vaccine  Completed   Zoster (Shingles) Vaccine  Completed   Hepatitis B Vaccine  Aged Out   HPV Vaccine  Aged Out   Meningitis B Vaccine  Aged Out  *Topic was postponed. The date shown is not the original due date.    Flu shot given Mammo yesterday-pending report  Discussed fall prevention, supplements and exercise for bone density  PHQ 1 Commended good habits       Mild anemia   No anemia today      Insomnia   Effexor  xr 37.5 is helpful  Considering progesterone replacement       Hyperlipidemia   Disc goals for lipids and reasons to control them Rev last labs with pt Rev low sat fat diet in detail Improved with LDL 102 Continues crestor  5 mg daily  Ascvd risk 2.5%      Relevant Medications   rosuvastatin  (CRESTOR ) 5 MG tablet   Hormone replacement therapy (HRT)   Pt takes estradiol  0.0375 mg patch per gyn and is starting to reduce dose due to  age She is interested in progesterone for sleep /menopause symptoms Will d/w her gyn      Colon cancer screening   Colonoscopy 05/2023 normal but 5 y recall recommended for inoptimal prep      Other Visit Diagnoses       Need for influenza vaccination       Relevant Orders   Flu vaccine trivalent PF, 6mos and older(Flulaval,Afluria,Fluarix,Fluzone) (Completed)

## 2024-08-09 DIAGNOSIS — Z79890 Hormone replacement therapy (postmenopausal): Secondary | ICD-10-CM | POA: Insufficient documentation

## 2024-08-09 NOTE — Assessment & Plan Note (Signed)
 Reviewed health habits including diet and exercise and skin cancer prevention Reviewed appropriate screening tests for age  Also reviewed health mt list, fam hx and immunization status , as well as social and family history   See HPI Labs reviewed and ordered Health Maintenance  Topic Date Due   Mammogram  08/06/2024   Hepatitis C Screening  06/29/2029*   HIV Screening  06/29/2029*   DTaP/Tdap/Td vaccine (2 - Tdap) 02/20/2028   Colon Cancer Screening  05/13/2028   Pneumococcal Vaccine for age over 42  Completed   Flu Shot  Completed   COVID-19 Vaccine  Completed   Zoster (Shingles) Vaccine  Completed   Hepatitis B Vaccine  Aged Out   HPV Vaccine  Aged Out   Meningitis B Vaccine  Aged Out  *Topic was postponed. The date shown is not the original due date.    Flu shot given Mammo yesterday-pending report  Discussed fall prevention, supplements and exercise for bone density  PHQ 1 Commended good habits

## 2024-08-09 NOTE — Assessment & Plan Note (Signed)
 Disc goals for lipids and reasons to control them Rev last labs with pt Rev low sat fat diet in detail Improved with LDL 102 Continues crestor  5 mg daily  Ascvd risk 2.5%

## 2024-08-09 NOTE — Assessment & Plan Note (Signed)
 Pt is interested in vein clinic referral To thigh  Some discomfort Recommend supp hose to waist to prevent worsening Referral done

## 2024-08-09 NOTE — Assessment & Plan Note (Signed)
 Effexor  xr 37.5 is helpful  Considering progesterone replacement

## 2024-08-09 NOTE — Assessment & Plan Note (Signed)
No anemia today.

## 2024-08-09 NOTE — Assessment & Plan Note (Signed)
 Pt takes estradiol  0.0375 mg patch per gyn and is starting to reduce dose due to age She is interested in progesterone for sleep /menopause symptoms Will d/w her gyn

## 2024-08-09 NOTE — Assessment & Plan Note (Signed)
Colonoscopy 05/2023 normal but 5 y recall recommended for inoptimal prep

## 2024-08-12 ENCOUNTER — Ambulatory Visit: Payer: Self-pay | Admitting: Family Medicine

## 2024-08-19 ENCOUNTER — Encounter (INDEPENDENT_AMBULATORY_CARE_PROVIDER_SITE_OTHER): Payer: Self-pay | Admitting: Vascular Surgery

## 2024-08-19 ENCOUNTER — Ambulatory Visit (INDEPENDENT_AMBULATORY_CARE_PROVIDER_SITE_OTHER): Admitting: Vascular Surgery

## 2024-08-19 ENCOUNTER — Encounter: Payer: Self-pay | Admitting: Otolaryngology

## 2024-08-19 VITALS — BP 126/75 | HR 65 | Ht 64.0 in | Wt 149.0 lb

## 2024-08-19 DIAGNOSIS — E7800 Pure hypercholesterolemia, unspecified: Secondary | ICD-10-CM | POA: Diagnosis not present

## 2024-08-19 DIAGNOSIS — I83893 Varicose veins of bilateral lower extremities with other complications: Secondary | ICD-10-CM

## 2024-08-19 NOTE — Progress Notes (Unsigned)
 Patient ID: Beth Nielsen, female   DOB: [birth year], 63 y.o.   MRN: [id number]  Chief Complaint  Patient presents with   New Patient (Initial Visit)     np. Consult. Spider Veins.  TOWER, MARNE     HPI Beth Nielsen is a 63 y.o. female.  I am asked to see the patient by Dr. Randeen for evaluation of varicose veins of the lower extremities.  The patient presents with complaints of symptomatic varicosities of the legs. The patient reports a long standing history of varicosities and they have become painful over time. There was no clear inciting event or causative factor that started the symptoms.  The legs are roughly equal in their volume of varicosities.  The patient elevates the legs for relief. The patient complains of rare swelling as an associated symptom. The patient has no previous history of deep venous thrombosis or superficial thrombophlebitis to their knowledge.  She has had previous sclerotherapy to treat superficial veins about a decade ago.     Past Medical History:  Diagnosis Date   Anxiety 2011   Mild and under control/med management   History of chickenpox    History of UTI    Hyperlipidemia Aug. 2022   Med management-   Mild anemia 08/08/2023    Past Surgical History:  Procedure Laterality Date   ABDOMINAL HYSTERECTOMY  09/20/17   BLADDER SURGERY  2018   removal of benign growth   ECTOPIC PREGNANCY SURGERY  2003   HYSTEROTOMY  2018   TONSILLECTOMY  1972   WISDOM TOOTH EXTRACTION     age 22    Family History  Problem Relation Age of Onset   Alzheimer's disease Mother    Glaucoma Mother    Blindness Mother    Miscarriages / [place] Mother    Varicose Veins Mother    Heart failure Father    Hyperlipidemia Father    COPD Brother    Depression Brother    Hyperlipidemia Brother    Anxiety disorder Brother    Arthritis Paternal Grandmother    Alcohol abuse Paternal Uncle    Breast cancer Neg Hx       Social History   Tobacco Use   Smoking  status: Never   Smokeless tobacco: Never  Substance Use Topics   Alcohol use: Yes    Alcohol/week: 8.0 standard drinks of alcohol    Types: 7 Glasses of wine, 1 Standard drinks or equivalent per week    Comment: Wine in evening- occasional cocktails   Drug use: Never     No Known Allergies  Current Outpatient Medications  Medication Sig Dispense Refill   CALCIUM -VITAMIN D PO Take 1 capsule by mouth daily.     estradiol  (VIVELLE -DOT) 0.0375 MG/24HR Place 1 patch onto the skin 2 (two) times a week.     progesterone (PROMETRIUM) 200 MG capsule Take 200 mg by mouth.     rosuvastatin  (CRESTOR ) 5 MG tablet Take 1 tablet (5 mg total) by mouth daily. 90 tablet 3   venlafaxine  XR (EFFEXOR -XR) 37.5 MG 24 hr capsule Take 1 capsule (37.5 mg total) by mouth daily with breakfast. 90 capsule 3   No current facility-administered medications for this visit.      REVIEW OF SYSTEMS (Negative unless checked)  Constitutional: [] Weight loss  [] Fever  [] Chills Cardiac: [] Chest pain   [] Chest pressure   [] Palpitations   [] Shortness of breath when laying flat   [] Shortness of breath at rest   [] Shortness of breath  with exertion. Vascular:  [] Pain in legs with walking   [] Pain in legs at rest   [] Pain in legs when laying flat   [] Claudication   [] Pain in feet when walking  [] Pain in feet at rest  [] Pain in feet when laying flat   [] History of DVT   [] Phlebitis   [] Swelling in legs   [x] Varicose veins   [] Non-healing ulcers Pulmonary:   [] Uses home oxygen   [] Productive cough   [] Hemoptysis   [] Wheeze  [] COPD   [] Asthma Neurologic:  [] Dizziness  [] Blackouts   [] Seizures   [] History of stroke   [] History of TIA  [] Aphasia   [] Temporary blindness   [] Dysphagia   [] Weakness or numbness in arms   [] Weakness or numbness in legs Musculoskeletal:  [] Arthritis   [] Joint swelling   [] Joint pain   [] Low back pain Hematologic:  [] Easy bruising  [] Easy bleeding   [] Hypercoagulable state   [x] Anemic   [] Hepatitis Gastrointestinal:  [] Blood in stool   [] Vomiting blood  [] Gastroesophageal reflux/heartburn   [] Abdominal pain Genitourinary:  [] Chronic kidney disease   [] Difficult urination  [] Frequent urination  [] Burning with urination   [] Hematuria Skin:  [] Rashes   [] Ulcers   [] Wounds Psychological:  [x] History of anxiety   []  History of major depression.    Physical Exam BP 126/75   Pulse 65   Ht 5' 4 (1.626 m)   Wt 149 lb (67.6 kg)   BMI 25.58 kg/m  Gen:  WD/WN, NAD. Appears younger than stated age. Head: [place]/AT, No temporalis wasting.  Ear/Nose/Throat: Hearing grossly intact, dentition good Eyes: Sclera non-icteric. Conjunctiva clear Neck: Supple. Trachea midline Pulmonary:  Good air movement, no use of accessory muscles, respirations not labored.  Cardiac: RRR, No JVD Vascular: Varicosities diffuse and measuring up to 1-2 mm in the right lower extremity        Varicosities diffuse and measuring up to 1-2 mm in the left lower extremity Vessel Right Left  Radial Palpable Palpable                          PT Palpable Palpable  DP Palpable Palpable   Gastrointestinal: soft, non-tender/non-distended.  Musculoskeletal: M/S 5/5 throughout.   No LE edema Neurologic: Sensation grossly intact in extremities.  Symmetrical.  Speech is fluent.  Psychiatric: Judgment intact, Mood & affect appropriate for pt's clinical situation. Dermatologic: No rashes or ulcers noted.  No cellulitis or open wounds.    Radiology MM 3D SCREENING MAMMOGRAM BILATERAL BREAST Result Date: 08/12/2024 CLINICAL DATA:  Screening. EXAM: DIGITAL SCREENING BILATERAL MAMMOGRAM WITH TOMOSYNTHESIS AND CAD TECHNIQUE: Bilateral screening digital craniocaudal and mediolateral oblique mammograms were obtained. Bilateral screening digital breast tomosynthesis was performed. The images were evaluated with computer-aided detection. COMPARISON:  Previous exam(s). ACR Breast Density Category c: The breasts are  heterogeneously dense, which may obscure small masses. FINDINGS: There are no findings suspicious for malignancy. IMPRESSION: No mammographic evidence of malignancy. A result letter of this screening mammogram will be mailed directly to the patient. RECOMMENDATION: Screening mammogram in one year. (Code:SM-B-01Y) BI-RADS CATEGORY  1: Negative. Electronically Signed   By: Corean Salter M.D.   On: 08/12/2024 08:40    Labs Recent Results (from the past 2160 hours)  CBC with Differential/Platelet     Status: None   Collection Time: 07/29/24  8:17 AM  Result Value Ref Range   WBC 4.9 4.0 - 10.5 K/uL   RBC 4.27 3.87 - 5.11 Mil/uL   Hemoglobin  13.1 12.0 - 15.0 g/dL   HCT 61.1 63.9 - 53.9 %   MCV 91.0 78.0 - 100.0 fl   MCHC 33.8 30.0 - 36.0 g/dL   RDW 87.1 88.4 - 84.4 %   Platelets 213.0 150.0 - 400.0 K/uL   Neutrophils Relative % 55.1 43.0 - 77.0 %   Lymphocytes Relative 33.4 12.0 - 46.0 %   Monocytes Relative 7.8 3.0 - 12.0 %   Eosinophils Relative 3.4 0.0 - 5.0 %   Basophils Relative 0.3 0.0 - 3.0 %   Neutro Abs 2.7 1.4 - 7.7 K/uL   Lymphs Abs 1.6 0.7 - 4.0 K/uL   Monocytes Absolute 0.4 0.1 - 1.0 K/uL   Eosinophils Absolute 0.2 0.0 - 0.7 K/uL   Basophils Absolute 0.0 0.0 - 0.1 K/uL  Comprehensive metabolic panel     Status: None   Collection Time: 07/29/24  8:17 AM  Result Value Ref Range   Sodium 140 135 - 145 mEq/L   Potassium 4.2 3.5 - 5.1 mEq/L   Chloride 101 96 - 112 mEq/L   CO2 29 19 - 32 mEq/L   Glucose, Bld 87 70 - 99 mg/dL   BUN 10 6 - 23 mg/dL   Creatinine, Ser 9.25 0.40 - 1.20 mg/dL   Total Bilirubin 0.7 0.2 - 1.2 mg/dL   Alkaline Phosphatase 50 39 - 117 U/L   AST 21 0 - 37 U/L   ALT 14 0 - 35 U/L   Total Protein 7.0 6.0 - 8.3 g/dL   Albumin 4.3 3.5 - 5.2 g/dL   GFR 12.60 >39.99 mL/min    Comment: Calculated using the CKD-EPI Creatinine Equation (2021)   Calcium  9.3 8.4 - 10.5 mg/dL  Lipid panel     Status: Abnormal   Collection Time: 07/29/24  8:17 AM   Result Value Ref Range   Cholesterol 189 0 - 200 mg/dL    Comment: ATP III Classification       Desirable:  < 200 mg/dL               Borderline High:  200 - 239 mg/dL          High:  > = 759 mg/dL   Triglycerides 24.9 0.0 - 149.0 mg/dL    Comment: Normal:  <849 mg/dLBorderline High:  150 - 199 mg/dL   HDL 27.89 >60.99 mg/dL   VLDL 84.9 0.0 - 59.9 mg/dL   LDL Cholesterol 897 (H) 0 - 99 mg/dL   Total CHOL/HDL Ratio 3     Comment:                Men          Women1/2 Average Risk     3.4          3.3Average Risk          5.0          4.42X Average Risk          9.6          7.13X Average Risk          15.0          11.0                       NonHDL 117.02     Comment: NOTE:  Non-HDL goal should be 30 mg/dL higher than patient's LDL goal (i.e. LDL goal of < 70 mg/dL, would have non-HDL goal of < 100 mg/dL)  TSH     Status: None   Collection Time: 07/29/24  8:17 AM  Result Value Ref Range   TSH 2.23 0.35 - 5.50 uIU/mL    Assessment/Plan:  Varicose veins of bilateral lower extremities with other complications   The patient has symptoms consistent with chronic venous insufficiency. We discussed the natural history and treatment options for venous disease. I recommended the regular use of 20 - 30 mm Hg compression stockings, and prescribed these today. I recommended leg elevation and anti-inflammatories as needed for pain. I have also recommended a complete venous duplex to assess the venous system for reflux or thrombotic issues. This can be done at the patient's convenience. I will see the patient back after the duplex to assess the response to conservative management, and determine further treatment options.  Hyperlipidemia lipid control important in reducing the progression of atherosclerotic disease. Continue statin therapy    Selinda Gu 08/20/2024, 8:15 AM   This note was created with Dragon medical transcription system.  Any errors from dictation are unintentional.

## 2024-08-20 DIAGNOSIS — I83893 Varicose veins of bilateral lower extremities with other complications: Secondary | ICD-10-CM | POA: Insufficient documentation

## 2024-09-01 DIAGNOSIS — R6884 Jaw pain: Secondary | ICD-10-CM | POA: Diagnosis not present

## 2024-09-01 DIAGNOSIS — M9901 Segmental and somatic dysfunction of cervical region: Secondary | ICD-10-CM | POA: Diagnosis not present

## 2024-09-01 DIAGNOSIS — M530 Cervicocranial syndrome: Secondary | ICD-10-CM | POA: Diagnosis not present

## 2024-09-03 DIAGNOSIS — M530 Cervicocranial syndrome: Secondary | ICD-10-CM | POA: Diagnosis not present

## 2024-09-03 DIAGNOSIS — R6884 Jaw pain: Secondary | ICD-10-CM | POA: Diagnosis not present

## 2024-09-03 DIAGNOSIS — M9901 Segmental and somatic dysfunction of cervical region: Secondary | ICD-10-CM | POA: Diagnosis not present

## 2024-09-10 ENCOUNTER — Other Ambulatory Visit: Payer: Self-pay | Admitting: Medical Genetics

## 2024-09-10 DIAGNOSIS — R6884 Jaw pain: Secondary | ICD-10-CM | POA: Diagnosis not present

## 2024-09-11 ENCOUNTER — Other Ambulatory Visit
Admission: RE | Admit: 2024-09-11 | Discharge: 2024-09-11 | Disposition: A | Discharge status: Home / Self Care | Payer: Self-pay | Source: Ambulatory Visit | Attending: Medical Genetics | Admitting: Medical Genetics

## 2024-09-11 ENCOUNTER — Encounter: Payer: Self-pay | Admitting: Family Medicine

## 2024-09-22 LAB — GENECONNECT MOLECULAR SCREEN: Genetic Analysis Overall Interpretation: NEGATIVE

## 2024-09-26 DIAGNOSIS — M530 Cervicocranial syndrome: Secondary | ICD-10-CM | POA: Diagnosis not present

## 2024-09-26 DIAGNOSIS — M9901 Segmental and somatic dysfunction of cervical region: Secondary | ICD-10-CM | POA: Diagnosis not present

## 2024-09-26 DIAGNOSIS — R6884 Jaw pain: Secondary | ICD-10-CM | POA: Diagnosis not present

## 2024-09-30 ENCOUNTER — Ambulatory Visit (INDEPENDENT_AMBULATORY_CARE_PROVIDER_SITE_OTHER): Admitting: Vascular Surgery

## 2024-09-30 ENCOUNTER — Other Ambulatory Visit (INDEPENDENT_AMBULATORY_CARE_PROVIDER_SITE_OTHER)

## 2024-09-30 ENCOUNTER — Encounter (INDEPENDENT_AMBULATORY_CARE_PROVIDER_SITE_OTHER): Payer: Self-pay | Admitting: Vascular Surgery

## 2024-09-30 VITALS — BP 113/74 | HR 64 | Resp 18 | Ht 64.0 in | Wt 151.2 lb

## 2024-09-30 DIAGNOSIS — E7800 Pure hypercholesterolemia, unspecified: Secondary | ICD-10-CM

## 2024-09-30 DIAGNOSIS — I83893 Varicose veins of bilateral lower extremities with other complications: Secondary | ICD-10-CM

## 2024-09-30 NOTE — Progress Notes (Signed)
 MRN : [id number]  Beth Nielsen is a 63 y.o. ([birth year]) female who presents with chief complaint of  Chief Complaint  Patient presents with   Follow-up    Follow up Patient conv. Bilateral reflux  .  History of Present Illness: Patient returns today in follow up of varicose veins.  Her legs are about the same.  No major changes with consistent use of compression socks and exercise.  Her venous study today shows reflux at the right saphenofemoral junction.  No other significant reflux was seen.  No DVT or superficial thrombophlebitis was seen.  Current Outpatient Medications  Medication Sig Dispense Refill   CALCIUM -VITAMIN D PO Take 1 capsule by mouth daily.     estradiol  (VIVELLE -DOT) 0.0375 MG/24HR Place 1 patch onto the skin 2 (two) times a week.     progesterone (PROMETRIUM) 200 MG capsule Take 200 mg by mouth.     rosuvastatin  (CRESTOR ) 5 MG tablet Take 1 tablet (5 mg total) by mouth daily. 90 tablet 3   venlafaxine  XR (EFFEXOR -XR) 37.5 MG 24 hr capsule Take 1 capsule (37.5 mg total) by mouth daily with breakfast. 90 capsule 3   No current facility-administered medications for this visit.    Past Medical History:  Diagnosis Date   Anxiety 2011   Mild and under control/med management   History of chickenpox    History of UTI    Hyperlipidemia Aug. 2022   Med management-   Mild anemia 08/08/2023    Past Surgical History:  Procedure Laterality Date   ABDOMINAL HYSTERECTOMY  09/20/17   BLADDER SURGERY  2018   removal of benign growth   ECTOPIC PREGNANCY SURGERY  2003   HYSTEROTOMY  2018   TONSILLECTOMY  1972   WISDOM TOOTH EXTRACTION     age 55     Social History   Tobacco Use   Smoking status: Never   Smokeless tobacco: Never  Substance Use Topics   Alcohol use: Yes    Alcohol/week: 8.0 standard drinks of alcohol    Types: 7 Glasses of wine, 1 Standard drinks or equivalent per week    Comment: Wine in evening- occasional cocktails   Drug use: Never       Family History  Problem Relation Age of Onset   Alzheimer's disease Mother    Glaucoma Mother    Blindness Mother    Miscarriages / Stillbirths Mother    Varicose Veins Mother    Heart failure Father    Hyperlipidemia Father    COPD Brother    Depression Brother    Hyperlipidemia Brother    Anxiety disorder Brother    Arthritis Paternal Grandmother    Alcohol abuse Paternal Uncle    Breast cancer Neg Hx      No Known Allergies   REVIEW OF SYSTEMS (Negative unless checked)   Constitutional: [] Weight loss  [] Fever  [] Chills Cardiac: [] Chest pain   [] Chest pressure   [] Palpitations   [] Shortness of breath when laying flat   [] Shortness of breath at rest   [] Shortness of breath with exertion. Vascular:  [] Pain in legs with walking   [] Pain in legs at rest   [] Pain in legs when laying flat   [] Claudication   [] Pain in feet when walking  [] Pain in feet at rest  [] Pain in feet when laying flat   [] History of DVT   [] Phlebitis   [] Swelling in legs   [x] Varicose veins   [] Non-healing ulcers Pulmonary:   [] Uses home oxygen   []   Productive cough   [] Hemoptysis   [] Wheeze  [] COPD   [] Asthma Neurologic:  [] Dizziness  [] Blackouts   [] Seizures   [] History of stroke   [] History of TIA  [] Aphasia   [] Temporary blindness   [] Dysphagia   [] Weakness or numbness in arms   [] Weakness or numbness in legs Musculoskeletal:  [] Arthritis   [] Joint swelling   [] Joint pain   [] Low back pain Hematologic:  [] Easy bruising  [] Easy bleeding   [] Hypercoagulable state   [x] Anemic  [] Hepatitis Gastrointestinal:  [] Blood in stool   [] Vomiting blood  [] Gastroesophageal reflux/heartburn   [] Abdominal pain Genitourinary:  [] Chronic kidney disease   [] Difficult urination  [] Frequent urination  [] Burning with urination   [] Hematuria Skin:  [] Rashes   [] Ulcers   [] Wounds Psychological:  [x] History of anxiety   []  History of major depression.  Physical Examination  BP 113/74 (BP Location: Left Arm, Patient  Position: Sitting, Cuff Size: Normal)   Pulse 64   Resp 18   Ht 5' 4 (1.626 m)   Wt 151 lb 3.2 oz (68.6 kg)   BMI 25.95 kg/m  Gen:  WD/WN, NAD Head: [place]/AT, No temporalis wasting. Ear/Nose/Throat: Hearing grossly intact, nares w/o erythema or drainage Eyes: Conjunctiva clear. Sclera non-icteric Neck: Supple.  Trachea midline Pulmonary:  Good air movement, no use of accessory muscles.  Cardiac: RRR, no JVD Vascular:  Vessel Right Left  Radial Palpable Palpable                          PT Palpable Palpable  DP Palpable Palpable   Gastrointestinal: soft, non-tender/non-distended. No guarding/reflex.  Musculoskeletal: M/S 5/5 throughout.  No deformity or atrophy. Diffuse superficial varicosities. No edema. Neurologic: Sensation grossly intact in extremities.  Symmetrical.  Speech is fluent.  Psychiatric: Judgment intact, Mood & affect appropriate for pt's clinical situation. Dermatologic: No rashes or ulcers noted.  No cellulitis or open wounds.      Labs Recent Results (from the past 2160 hours)  CBC with Differential/Platelet     Status: None   Collection Time: 07/29/24  8:17 AM  Result Value Ref Range   WBC 4.9 4.0 - 10.5 K/uL   RBC 4.27 3.87 - 5.11 Mil/uL   Hemoglobin 13.1 12.0 - 15.0 g/dL   HCT 61.1 63.9 - 53.9 %   MCV 91.0 78.0 - 100.0 fl   MCHC 33.8 30.0 - 36.0 g/dL   RDW 87.1 88.4 - 84.4 %   Platelets 213.0 150.0 - 400.0 K/uL   Neutrophils Relative % 55.1 43.0 - 77.0 %   Lymphocytes Relative 33.4 12.0 - 46.0 %   Monocytes Relative 7.8 3.0 - 12.0 %   Eosinophils Relative 3.4 0.0 - 5.0 %   Basophils Relative 0.3 0.0 - 3.0 %   Neutro Abs 2.7 1.4 - 7.7 K/uL   Lymphs Abs 1.6 0.7 - 4.0 K/uL   Monocytes Absolute 0.4 0.1 - 1.0 K/uL   Eosinophils Absolute 0.2 0.0 - 0.7 K/uL   Basophils Absolute 0.0 0.0 - 0.1 K/uL  Comprehensive metabolic panel     Status: None   Collection Time: 07/29/24  8:17 AM  Result Value Ref Range   Sodium 140 135 - 145 mEq/L    Potassium 4.2 3.5 - 5.1 mEq/L   Chloride 101 96 - 112 mEq/L   CO2 29 19 - 32 mEq/L   Glucose, Bld 87 70 - 99 mg/dL   BUN 10 6 - 23 mg/dL   Creatinine, Ser 9.25  0.40 - 1.20 mg/dL   Total Bilirubin 0.7 0.2 - 1.2 mg/dL   Alkaline Phosphatase 50 39 - 117 U/L   AST 21 0 - 37 U/L   ALT 14 0 - 35 U/L   Total Protein 7.0 6.0 - 8.3 g/dL   Albumin 4.3 3.5 - 5.2 g/dL   GFR 12.60 >39.99 mL/min    Comment: Calculated using the CKD-EPI Creatinine Equation (2021)   Calcium  9.3 8.4 - 10.5 mg/dL  Lipid panel     Status: Abnormal   Collection Time: 07/29/24  8:17 AM  Result Value Ref Range   Cholesterol 189 0 - 200 mg/dL    Comment: ATP III Classification       Desirable:  < 200 mg/dL               Borderline High:  200 - 239 mg/dL          High:  > = 759 mg/dL   Triglycerides 24.9 0.0 - 149.0 mg/dL    Comment: Normal:  <849 mg/dLBorderline High:  150 - 199 mg/dL   HDL 27.89 >60.99 mg/dL   VLDL 84.9 0.0 - 59.9 mg/dL   LDL Cholesterol 897 (H) 0 - 99 mg/dL   Total CHOL/HDL Ratio 3     Comment:                Men          Women1/2 Average Risk     3.4          3.3Average Risk          5.0          4.42X Average Risk          9.6          7.13X Average Risk          15.0          11.0                       NonHDL 117.02     Comment: NOTE:  Non-HDL goal should be 30 mg/dL higher than patient's LDL goal (i.e. LDL goal of < 70 mg/dL, would have non-HDL goal of < 100 mg/dL)  TSH     Status: None   Collection Time: 07/29/24  8:17 AM  Result Value Ref Range   TSH 2.23 0.35 - 5.50 uIU/mL  [facility] Molecular Screen - Blood ([facility])     Status: None   Collection Time: 09/11/24 12:31 PM  Result Value Ref Range   Genetic Analysis Overall Interpretation Negative    Genetic Disease Assessed      This is a screening test and does not detect all pathogenic or likely pathogenic variant(s) in the tested genes; diagnostic testing is recommended for individuals with a personal or family history of  heart disease or hereditary cancer. [facility]  Population Screen is a screening test that analyzes 11 genes related to hereditary breast and ovarian cancer (HBOC) syndrome, Lynch syndrome, and familial hypercholesterolemia. This test only reports clinically significant pathogenic and likely  pathogenic variants but does not report variants of uncertain significance (VUS). In addition, analysis of the PMS2 gene excludes exons 11-15, which overlap with a known pseudogene (PMS2CL).    Genetic Analysis Report      No pathogenic or likely pathogenic variants were detected in the genes analyzed by this test.Genetic test results should be interpreted in the context of  an individual's personal medical and family history. Alteration to medical management is NOT  recommended based solely on this result. Clinical correlation is advised.Additional Considerations- This is a screening test; individuals may still carry pathogenic or likely pathogenic variant(s) in the tested genes that are not detected by this test.-  For individuals at risk for these or other related conditions based on factors including personal or family history, diagnostic testing is recommended.- The absence of pathogenic or likely pathogenic variant(s) in the analyzed genes, while reassuring,  does not eliminate the possibility of a hereditary condition; there are other variants and genes associated with heart disease and hereditary cancer that are not included in this test.    Genes Tested See Notes     Comment: APOB, BRCA1, BRCA2, EPCAM, LDLR, LDLRAP1, PCSK9, PMS2, MLH1, MSH2, MSH6   Disclaimer See Notes     Comment: This test was developed and validated by [facility] This test has not been cleared or approved by the [place]  Food and Drug Administration (FDA). The [facility] laboratory is accredited by the College of American Pathologists (CAP) and certified under  the Clinical Laboratory Improvement Amendments (CLIA #: [id number]) to  perform high-complexity clinical tests. This test is used for clinical purposes. It should not be regarded as investigational use only or for research use only.    Sequencing Location See Notes     Comment: Sequencing done at [facility] [address], [address], [place] [address] (CLIA# [id number])   Interpretation Methods and Limitations See Notes     Comment: Extracted DNA is enriched for targeted regions and then sequenced using the [facility] Exome+ (R) assay on an Illumina DNA sequencing system. Data is then aligned to a modified version of GRCh38 and all genes are analyzed using the MANE transcript and MANE  Plus Clinical transcript, when available. Small variant calling is completed using a customized version of [employer]'s DNAseq software, augmented by a proprietary small variant caller for difficult variants. Copy number variants (CNVs) are then called  using a proprietary bioinformatics pipeline based on depth analysis with a comparison to similarly sequenced samples. Analysis of the PMS2 gene is limited to exons 1-10. The interpretation and reporting of variants in APOB, PCSK9, and LDLR is specific to  familial hypercholesterolemia; variants associated with hypobetalipoproteinemia are not included. Interpretation is based upon guidelines published by the [employer] and [employer]), the Association for Mol ecular Pathology  (AMP) or their modification by [employer] when available and/or review of previous clinical assertions available in the [employer]. Interpretation is limited to the transcripts indicated on the report and +/- 10 bp into  intronic regions, except as noted below. [facility] variant classifications include pathogenic, likely pathogenic, variant of uncertain significance (VUS), likely benign, and benign. Only variants classified as pathogenic and likely pathogenic are included in  the report. All reported  variants are confirmed through secondary manual inspection of DNA sequence data or orthogonal testing. Risk estimations and management guidelines included in this report are based on analysis of primary literature and  recommendations of applicable professional societies, and should be regarded as approximations.Based on validation studies,this assay delivers > 99% sensitivity and specificity for single nucleotide variants and insertions  and deletions (indels) up to 20  bp. Larger indels and complex variants are also reported but sensitivity may be reduced. Based on validation studies, this assay delivers > 99% sensitivity to multi-exon CNVs and > 90% sensitivity to single-exon  CNVs. This test may not detect variants  in challenging regions (such as short tandem repeats, homopolymer runs, and segment duplications), sub-exonic CNVs, chromosomal aneuploidy, or variants in the presence of mosaicism. Phasing will be attempted and reported, when possible. Structural  rearrangements such as inversions, translocations, and gene conversions are not tested in this assay unless explicitly indicated. Additionally, deep intronic, promoter, and enhancer regions may not be covered. It is important to note that this is a  screening test and cannot detect all disease-causing variants. A negative result does not guarantee the absence of a rare, undetectable variant in the genes analyzed; consider using a diagnostic test if there is  significant personal and/or family history  of one of the conditions analyzed by this test. Any potential incidental findings outside of these genes and conditions will not be identified, nor reported. The results of a genetic test may be influenced by various factors, including bone marrow  transplantation, blood transfusions, or in rare cases, hematolymphoid neoplasms.Gene Specific Notes:APOB: analysis is limited to c.10580G>A and c.10579C>T; BRCA1: sequencing analysis extends to CDS +/-20 bp;  BRCA2: sequencing analysis extends to CDS  +/-20 bp. EPCAM: analysis is limited to CNVof exons 8-9; LDLR: analysis includes CNV ofthe promoter; MLH1: analysis includes CNV of the promoter; PMS2: analysis is limited to exons 1-10.Donnice JINNY Kemp, PhD, [email or web address]     Radiology No results found.  Assessment/Plan  Hyperlipidemia lipid control important in reducing the progression of atherosclerotic disease. Continue statin therapy   Varicose veins of bilateral lower extremities with other complications Her venous study today shows reflux at the right saphenofemoral junction.  No other significant reflux was seen.  No DVT or superficial thrombophlebitis was seen. I would not recommend a laser ablation for this, but sclerotherapy for the symptomatic superficial varicosities would certainly be reasonable.  We discussed the risks and benefits of the sclerotherapy treatments and she desires to proceed.    Selinda Gu, MD  09/30/2024 11:20 AM    This note was created with Dragon medical transcription system.  Any errors from dictation are purely unintentional

## 2024-09-30 NOTE — Assessment & Plan Note (Signed)
 lipid control important in reducing the progression of atherosclerotic disease. Continue statin therapy

## 2024-09-30 NOTE — Assessment & Plan Note (Signed)
 Her venous study today shows reflux at the right saphenofemoral junction.  No other significant reflux was seen.  No DVT or superficial thrombophlebitis was seen. I would not recommend a laser ablation for this, but sclerotherapy for the symptomatic superficial varicosities would certainly be reasonable.  We discussed the risks and benefits of the sclerotherapy treatments and she desires to proceed.

## 2024-10-21 ENCOUNTER — Telehealth (INDEPENDENT_AMBULATORY_CARE_PROVIDER_SITE_OTHER): Payer: Self-pay

## 2024-10-21 NOTE — Telephone Encounter (Signed)
 Patient reach out to the office with questions for upcoming sclerotherapy. Patient questions were answer and patient will contact if she has any further questions.

## 2024-11-06 ENCOUNTER — Ambulatory Visit (INDEPENDENT_AMBULATORY_CARE_PROVIDER_SITE_OTHER): Admitting: Nurse Practitioner

## 2024-11-06 ENCOUNTER — Encounter (INDEPENDENT_AMBULATORY_CARE_PROVIDER_SITE_OTHER): Payer: Self-pay | Admitting: Nurse Practitioner

## 2024-11-06 VITALS — BP 137/81 | HR 71 | Resp 18 | Ht 64.0 in | Wt 150.0 lb

## 2024-11-06 DIAGNOSIS — I83893 Varicose veins of bilateral lower extremities with other complications: Secondary | ICD-10-CM | POA: Diagnosis not present

## 2024-11-09 ENCOUNTER — Encounter (INDEPENDENT_AMBULATORY_CARE_PROVIDER_SITE_OTHER): Payer: Self-pay | Admitting: Nurse Practitioner

## 2024-11-09 NOTE — Progress Notes (Signed)
 Varicose veins of bilateral  lower extremity with inflammation (454.1  I83.10) Current Plans   Indication: Patient presents with symptomatic varicose veins of the bilateral  lower extremity.   Procedure: Sclerotherapy using hypertonic saline mixed with 1% Lidocaine was performed on the bilateral lower extremity. Compression wraps were placed. The patient tolerated the procedure well.

## 2024-12-02 ENCOUNTER — Ambulatory Visit (INDEPENDENT_AMBULATORY_CARE_PROVIDER_SITE_OTHER): Admitting: Nurse Practitioner

## 2024-12-02 ENCOUNTER — Encounter (INDEPENDENT_AMBULATORY_CARE_PROVIDER_SITE_OTHER): Payer: Self-pay | Admitting: Nurse Practitioner

## 2024-12-02 VITALS — BP 130/80 | HR 71 | Resp 18 | Wt 152.8 lb

## 2024-12-02 DIAGNOSIS — I83893 Varicose veins of bilateral lower extremities with other complications: Secondary | ICD-10-CM

## 2024-12-07 ENCOUNTER — Encounter (INDEPENDENT_AMBULATORY_CARE_PROVIDER_SITE_OTHER): Payer: Self-pay | Admitting: Nurse Practitioner

## 2024-12-07 NOTE — Progress Notes (Signed)
 Varicose veins of bilateral  lower extremity with inflammation (454.1  I83.10) Current Plans   Indication: Patient presents with symptomatic varicose veins of the bilateral  lower extremity.   Procedure: Sclerotherapy using hypertonic saline mixed with 1% Lidocaine was performed on the bilateral lower extremity. Compression wraps were placed. The patient tolerated the procedure well.
